# Patient Record
Sex: Female | Born: 1990 | Race: Black or African American | Hispanic: No | Marital: Single | Smoking: Former smoker
Health system: Southern US, Community
[De-identification: ages and names within clinical notes are randomized; demographics above are authoritative.]

## PROBLEM LIST (undated history)

## (undated) ENCOUNTER — Inpatient Hospital Stay (HOSPITAL_COMMUNITY): Payer: Self-pay

## (undated) DIAGNOSIS — E119 Type 2 diabetes mellitus without complications: Secondary | ICD-10-CM

## (undated) DIAGNOSIS — O24419 Gestational diabetes mellitus in pregnancy, unspecified control: Secondary | ICD-10-CM

## (undated) HISTORY — DX: Type 2 diabetes mellitus without complications: E11.9

## (undated) HISTORY — DX: Gestational diabetes mellitus in pregnancy, unspecified control: O24.419

## (undated) HISTORY — PX: NO PAST SURGERIES: SHX2092

---

## 2014-05-01 ENCOUNTER — Emergency Department (HOSPITAL_COMMUNITY)
Admission: EM | Admit: 2014-05-01 | Discharge: 2014-05-01 | Disposition: A | Discharge status: Home / Self Care | Payer: Medicaid Other | Attending: Emergency Medicine | Admitting: Emergency Medicine

## 2014-05-01 ENCOUNTER — Encounter (HOSPITAL_COMMUNITY): Payer: Self-pay | Admitting: Emergency Medicine

## 2014-05-01 DIAGNOSIS — Z3490 Encounter for supervision of normal pregnancy, unspecified, unspecified trimester: Secondary | ICD-10-CM

## 2014-05-01 DIAGNOSIS — Z3201 Encounter for pregnancy test, result positive: Secondary | ICD-10-CM | POA: Diagnosis not present

## 2014-05-01 DIAGNOSIS — Z87891 Personal history of nicotine dependence: Secondary | ICD-10-CM | POA: Insufficient documentation

## 2014-05-01 DIAGNOSIS — Z3200 Encounter for pregnancy test, result unknown: Secondary | ICD-10-CM | POA: Diagnosis present

## 2014-05-01 LAB — POC URINE PREG, ED: Preg Test, Ur: POSITIVE — AB

## 2014-05-01 MED ORDER — PRENATAL COMPLETE 14-0.4 MG PO TABS
1.0000 | ORAL_TABLET | Freq: Every day | ORAL | Status: DC
Start: 2014-05-01 — End: 2014-06-04

## 2014-05-01 NOTE — ED Notes (Signed)
Pt presents with c/o wanting to see how far along she is with her pregnancy. Pt took a pregnancy test last week and now presents to the ER to find out how far along she is.

## 2014-05-01 NOTE — ED Provider Notes (Signed)
CSN: [id number]     Arrival date & time 05/01/14  1838 History   First MD Initiated Contact with Patient 05/01/14 1844   This chart was scribed for non-physician practitioner, Wynetta EmeryNicole Pisciotta, PA, working with Lyanne CoKevin M Campos, MD by Marica OtterNusrat Rahman, ED Scribe. This patient was seen in room WTR8/WTR8 and the patient's care was started at [facility]:54 PM.  Chief Complaint  Patient presents with  . Possible Pregnancy   The history is provided by the patient. No language interpreter was used.   HPI Comments: Alicia Pollard is a 23 y.o. female who presents to the Emergency Department to determine how far along she is in her pregnancy. Patient states that in her last pregnancy she went to the emergency room and he tested [place] for dates [place]. States that she's not sure she wants to continue on with the pregnancy and wants to nature however along she is. Has no local OB/GYN. Pt reports her last menstrual period was on June 11. Pt reports she had a positive OTC pregnancy test last week. Pt denies any abdominal pain, vaginal bleeding, dysuria, abnormal vaginal bleeding syncope, palpitations, shortness of breath, nausea vomiting, change in bowel or bladder habits..   History reviewed. No pertinent past medical history. History reviewed. No pertinent past surgical history. No family history on file. History  Substance Use Topics  . Smoking status: Former Games developermoker  . Smokeless tobacco: Not on file  . Alcohol Use: No   OB History   Grav Para Term Preterm Abortions TAB SAB Ect Mult Living   1              Review of Systems  Gastrointestinal: Negative for abdominal pain.  All other systems reviewed and are negative.  Allergies  Review of patient's allergies indicates no known allergies.  Home Medications   Prior to Admission medications   Medication Sig Start Date End Date Taking? Authorizing Provider  Prenatal Vit-Fe Fumarate-FA (PRENATAL COMPLETE) 14-0.4 MG TABS Take 1 tablet by mouth  daily. 05/01/14   Nicole Pisciotta, PA-C   Triage Vitals: BP 144/81  Pulse 90  Temp(Src) 98.2 F (36.8 C) (Oral)  Resp 18  SpO2 99% Physical Exam  Nursing note and vitals reviewed. Constitutional: She is oriented to person, place, and time. She appears well-developed and well-nourished. No distress.  HENT:  Head: Normocephalic and atraumatic.  Mouth/Throat: Oropharynx is clear and moist.  Eyes: Conjunctivae and EOM are normal. Pupils are equal, round, and reactive to light.  Neck: Normal range of motion. Neck supple. No tracheal deviation present.  Cardiovascular: Normal rate, regular rhythm and intact distal pulses.   Pulmonary/Chest: Effort normal and breath sounds normal. No respiratory distress. She has no wheezes. She has no rales. She exhibits no tenderness.  Abdominal: Soft. Bowel sounds are normal. She exhibits no distension. There is no tenderness. There is no rebound and no guarding.  Musculoskeletal: Normal range of motion.  Neurological: She is alert and oriented to person, place, and time.  Skin: Skin is warm and dry.  Psychiatric: She has a normal mood and affect. Her behavior is normal.    ED Course  Procedures (including critical care time) DIAGNOSTIC STUDIES: Oxygen Saturation is 99% on RA, nl by my interpretation.    COORDINATION OF CARE: 6:57 PM-Discussed treatment plan which [facility], [facility], [facility] with pt at bedside and pt declined [facility], otherwise agreed to plan.  Labs Review Labs Reviewed  POC URINE PREG, ED -  Abnormal; Notable for the following:    Preg Test, Ur POSITIVE (*)    All other components within normal limits    Imaging Review No results found.   EKG Interpretation None      MDM   Final diagnoses:  Pregnancy    Filed Vitals:   05/01/14 1846 05/01/14 1959  BP: 144/81   Pulse: 90 85  Temp: 98.2 F (36.8 C) 97.8 F (36.6 C)  TempSrc: Oral Oral  Resp: 18 20  SpO2: 99% 100%    Alicia Pollard is a 23 y.o. female presenting with first trimester pregnancy. Last period was last month. Abdominal exam is benign. She has no emergent indications for further evaluation in the ED. I have encouraged her to followup with OB/GYN. She is not sure she wants to continue the pregnancy. I will give [facility] contact information for both [facility] and [facility] health.  Evaluation does not show pathology that would require ongoing emergent intervention or inpatient treatment. Pt is hemodynamically stable and mentating appropriately. Discussed findings and plan with patient/guardian, who agrees with care plan. All questions answered. Return precautions discussed and outpatient follow up given.   New Prescriptions   PRENATAL VIT-FE FUMARATE-FA (PRENATAL COMPLETE) 14-0.4 MG TABS    Take 1 tablet by mouth daily.   I personally performed the services described in this documentation, which was scribed in my presence. The recorded information has been reviewed and is accurate.    Wynetta Emeryicole Pisciotta, PA-C 05/01/14 2002

## 2014-05-01 NOTE — ED Notes (Signed)
Pt presents with c/o wanting to find out how far along she is in her pregnancy. Positive pregnancy test at home last week.

## 2014-05-01 NOTE — Discharge Instructions (Signed)
Please go to [facility] (Address: [address], [place], [place] [address] Phone: [phone])   Follow with OB/GYN as soon as possible.   Do NOT take any NSAIDs, such as Aspirin, Motrin, Ibuprofen, Aleve, Naproxen etc. Only take Tylenol for pain. Return to the emergency room  for any severe abdominal pain, increasing vaginal bleeding, passing out or repeated vomiting.   Obtain over-the-counter prenatal vitamins. Read the label and make sure that they have at least 400 mcg of folate acid.   Please go to the [facility] office [place] [address] to apply for coverage. Alternatively, you can could go to the DSS office in [place] to apply for emergency coverage.    Pregnancy - First Trimester During sexual intercourse, millions of sperm go into the vagina. Only 1 sperm will penetrate and fertilize the female egg while it is in the Fallopian tube. One week later, the fertilized egg implants into the wall of the uterus. An embryo begins to develop into a baby. At 6 to 8 weeks, the eyes and face are formed and the heartbeat can be seen on ultrasound. At the end of 12 weeks (first trimester), all the baby's organs are formed. Now that you are pregnant, you will want to do everything you can to have a healthy baby. Two of the most important things are to get good prenatal care and follow your caregiver's instructions. Prenatal care is all the medical care you receive before the baby's birth. It is given to prevent, find, and treat problems during the pregnancy and childbirth. PRENATAL EXAMS  During prenatal visits, your weight, blood pressure, and urine are checked. This is done to make sure you are healthy and progressing normally during the pregnancy.  A pregnant woman should gain 25 to 35 pounds during the pregnancy. However, if you are overweight or underweight, your caregiver will advise you regarding your weight.  Your caregiver will ask and answer questions for you.  Blood work, cervical  cultures, other necessary tests, and a Pap test are done during your prenatal exams. These tests are done to check on your health and the probable health of your baby. Tests are strongly recommended and done for HIV with your permission. This is the virus that causes AIDS. These tests are done because medicines can be given to help prevent your baby from being born with this infection should you have been infected without knowing it. Blood work is also used to find out your blood type, previous infections, and follow your blood levels (hemoglobin).  Low hemoglobin (anemia) is common during pregnancy. Iron and vitamins are given to help prevent this. Later in the pregnancy, blood tests for diabetes will be done along with any other tests if any problems develop.  You may need other tests to make sure you and the baby are doing well. CHANGES DURING THE FIRST TRIMESTER  Your body goes through many changes during pregnancy. They vary from person to person. Talk to your caregiver about changes you notice and are concerned about. Changes can include:  Your menstrual period stops.  The egg and sperm carry the genes that determine what you look like. Genes from you and your partner are forming a baby. The female genes determine whether the baby is a boy or a girl.  Your body increases in girth and you may feel bloated.  Feeling sick to your stomach (nauseous) and throwing up (vomiting). If the vomiting is uncontrollable, call your caregiver.  Your breasts will begin to enlarge and  become tender.  Your nipples may stick out more and become darker.  The need to urinate more. Painful urination may mean you have a bladder infection.  Tiring easily.  Loss of appetite.  Cravings for certain kinds of food.  At first, you may gain or lose a couple of pounds.  You may have changes in your emotions from day to day (excited to be pregnant or concerned something may go wrong with the pregnancy and  baby).  You may have more vivid and strange dreams. HOME CARE INSTRUCTIONS   It is very important to avoid all smoking, alcohol and non-prescribed drugs during your pregnancy. These affect the formation and growth of the baby. Avoid chemicals while pregnant to ensure the delivery of a healthy infant.  Start your prenatal visits by the 12th week of pregnancy. They are usually scheduled monthly at first, then more often in the last 2 months before delivery. Keep your caregiver's appointments. Follow your caregiver's instructions regarding medicine use, blood and lab tests, exercise, and diet.  During pregnancy, you are providing food for you and your baby. Eat regular, well-balanced meals. Choose foods such as meat, fish, milk and other low fat dairy products, vegetables, fruits, and whole-grain breads and cereals. Your caregiver will tell you of the ideal weight gain.  You can help morning sickness by keeping soda crackers at the bedside. Eat a couple before arising in the morning. You may want to use the crackers without salt on them.  Eating 4 to 5 small meals rather than 3 large meals a day also may help the nausea and vomiting.  Drinking liquids between meals instead of during meals also seems to help nausea and vomiting.  A physical sexual relationship may be continued throughout pregnancy if there are no other problems. Problems may be early (premature) leaking of amniotic fluid from the membranes, vaginal bleeding, or belly (abdominal) pain.  Exercise regularly if there are no restrictions. Check with your caregiver or physical therapist if you are unsure of the safety of some of your exercises. Greater weight gain will occur in the last 2 trimesters of pregnancy. Exercising will help:  Control your weight.  Keep you in shape.  Prepare you for labor and delivery.  Help you lose your pregnancy weight after you deliver your baby.  Wear a good support or jogging bra for breast  tenderness during pregnancy. This may help if worn during sleep too.  Ask when prenatal classes are available. Begin classes when they are offered.  Do not use hot tubs, steam rooms, or saunas.  Wear your seat belt when driving. This protects you and your baby if you are in an accident.  Avoid raw meat, uncooked cheese, cat litter boxes, and soil used by cats throughout the pregnancy. These carry germs that can cause birth defects in the baby.  The first trimester is a good time to visit your dentist for your dental health. Getting your teeth cleaned is okay. Use a softer toothbrush and brush gently during pregnancy.  Ask for help if you have financial, counseling, or nutritional needs during pregnancy. Your caregiver will be able to offer counseling for these needs as well as refer you for other special needs.  Do not take any medicines or herbs unless told by your caregiver.  Inform your caregiver if there is any mental or physical domestic violence.  Make a list of emergency phone numbers of family, friends, hospital, and police and fire departments.  Write down your  questions. Take them to your prenatal visit.  Do not douche.  Do not cross your legs.  If you have to stand for long periods of time, rotate you feet or take small steps in a circle.  You may have more vaginal secretions that may require a sanitary pad. Do not use tampons or scented sanitary pads. MEDICINES AND DRUG USE IN PREGNANCY  Take prenatal vitamins as directed. The vitamin should contain 1 milligram of folic acid. Keep all vitamins out of reach of children. Only a couple vitamins or tablets containing iron may be fatal to a baby or young child when ingested.  Avoid use of all medicines, including herbs, over-the-counter medicines, not prescribed or suggested by your caregiver. Only take over-the-counter or prescription medicines for pain, discomfort, or fever as directed by your caregiver. Do not use aspirin,  ibuprofen, or naproxen unless directed by your caregiver.  Let your caregiver also know about herbs you may be using.  Alcohol is related to a number of birth defects. This includes fetal alcohol syndrome. All alcohol, in any form, should be avoided completely. Smoking will cause low birth rate and premature babies.  Street or illegal drugs are very harmful to the baby. They are absolutely forbidden. A baby born to an addicted mother will be addicted at birth. The baby will go through the same withdrawal an adult does.  Let your caregiver know about any medicines that you have to take and for what reason you take them. SEEK MEDICAL CARE IF:  You have any concerns or worries during your pregnancy. It is better to call with your questions if you feel they cannot wait, rather than worry about them. SEEK IMMEDIATE MEDICAL CARE IF:   An unexplained oral temperature above 102 F (38.9 C) develops, or as your caregiver suggests.  You have leaking of fluid from the vagina (birth canal). If leaking membranes are suspected, take your temperature and inform your caregiver of this when you call.  There is vaginal spotting or bleeding. Notify your caregiver of the amount and how many pads are used.  You develop a bad smelling vaginal discharge with a change in the color.  You continue to feel sick to your stomach (nauseated) and have no relief from remedies suggested. You vomit blood or coffee ground-like materials.  You lose more than 2 pounds of weight in 1 week.  You gain more than 2 pounds of weight in 1 week and you notice swelling of your face, hands, feet, or legs.  You gain 5 pounds or more in 1 week (even if you do not have swelling of your hands, face, legs, or feet).  You get exposed to [place] measles and have never had them.  You are exposed to fifth disease or chickenpox.  You develop belly (abdominal) pain. Round ligament discomfort is a common non-cancerous (benign) cause of  abdominal pain in pregnancy. Your caregiver still must evaluate this.  You develop headache, fever, diarrhea, pain with urination, or shortness of breath.  You fall or are in a car accident or have any kind of trauma.  There is mental or physical violence in your home. Document Released: 10/15/2001 Document Revised: 07/15/2012 Document Reviewed: 08/31/2013 [facility] Patient Information 2015 [employer] [place]. This information is not intended to replace advice given to you by your health care provider. Make sure you discuss any questions you have with your health care provider.

## 2014-05-02 NOTE — ED Provider Notes (Signed)
Medical screening examination/treatment/procedure(s) were performed by non-physician practitioner and as supervising physician I was immediately available for consultation/collaboration.   EKG Interpretation None        Lyanne CoKevin M Campos, MD 05/02/14 [phone]

## 2014-06-04 ENCOUNTER — Encounter (HOSPITAL_COMMUNITY): Payer: Self-pay | Admitting: Emergency Medicine

## 2014-06-04 ENCOUNTER — Emergency Department (HOSPITAL_COMMUNITY)
Admission: EM | Admit: 2014-06-04 | Discharge: 2014-06-04 | Disposition: A | Discharge status: Home / Self Care | Payer: Medicaid Other | Attending: Emergency Medicine | Admitting: Emergency Medicine

## 2014-06-04 DIAGNOSIS — O26899 Other specified pregnancy related conditions, unspecified trimester: Secondary | ICD-10-CM

## 2014-06-04 DIAGNOSIS — Z87891 Personal history of nicotine dependence: Secondary | ICD-10-CM | POA: Diagnosis not present

## 2014-06-04 DIAGNOSIS — O9989 Other specified diseases and conditions complicating pregnancy, childbirth and the puerperium: Secondary | ICD-10-CM | POA: Diagnosis not present

## 2014-06-04 DIAGNOSIS — R110 Nausea: Secondary | ICD-10-CM | POA: Insufficient documentation

## 2014-06-04 DIAGNOSIS — R52 Pain, unspecified: Secondary | ICD-10-CM

## 2014-06-04 DIAGNOSIS — M549 Dorsalgia, unspecified: Secondary | ICD-10-CM | POA: Insufficient documentation

## 2014-06-04 DIAGNOSIS — J069 Acute upper respiratory infection, unspecified: Secondary | ICD-10-CM | POA: Diagnosis not present

## 2014-06-04 DIAGNOSIS — J029 Acute pharyngitis, unspecified: Secondary | ICD-10-CM

## 2014-06-04 DIAGNOSIS — R0981 Nasal congestion: Secondary | ICD-10-CM

## 2014-06-04 MED ORDER — PREDNISONE 20 MG PO TABS: ORAL_TABLET | ORAL | Status: DC

## 2014-06-04 MED ORDER — ONDANSETRON 4 MG PO TBDP: 4.0000 mg | ORAL_TABLET | Freq: Three times a day (TID) | ORAL | Status: DC | PRN

## 2014-06-04 MED ORDER — FLUTICASONE PROPIONATE 50 MCG/ACT NA SUSP: 2.0000 | Freq: Every day | NASAL | Status: DC

## 2014-06-04 NOTE — ED Provider Notes (Signed)
Medical screening examination/treatment/procedure(s) were performed by non-physician practitioner and as supervising physician I was immediately available for consultation/collaboration.   EKG Interpretation None       Cannen Dupras K Linker, MD 06/04/14 2042 

## 2014-06-04 NOTE — Discharge Instructions (Signed)
Continue to stay well-hydrated. Gargle warm salt water and spit it out. Continue to alternate between Tylenol for pain or fever. May consider over-the-counter Benadryl for additional relief. Followup with your primary care doctor in 5-7 days for recheck of ongoing symptoms. Return to emergency department for emergent changing or worsening of symptoms. Use flonase to help with your nasal congestion. Use prednisone as directed to help with your neck pain related to the lymph nodes from your upper respiratory infection. Use zofran as needed for nausea, but see your OBGYN for the remainder of your prenatal care.     Upper Respiratory Infection, Adult An upper respiratory infection (URI) is also sometimes known as the common cold. The upper respiratory tract includes the nose, sinuses, throat, trachea, and bronchi. Bronchi are the airways leading to the lungs. Most people improve within 1 week, but symptoms can last up to 2 weeks. A residual cough may last even longer.  CAUSES Many different viruses can infect the tissues lining the upper respiratory tract. The tissues become irritated and inflamed and often become very moist. Mucus production is also common. A cold is contagious. You can easily spread the virus to others by oral contact. This includes kissing, sharing a glass, coughing, or sneezing. Touching your mouth or nose and then touching a surface, which is then touched by another person, can also spread the virus. SYMPTOMS  Symptoms typically develop 1 to 3 days after you come in contact with a cold virus. Symptoms vary from person to person. They may include:  Runny nose.  Sneezing.  Nasal congestion.  Sinus irritation.  Sore throat.  Loss of voice (laryngitis).  Cough.  Fatigue.  Muscle aches.  Loss of appetite.  Headache.  Low-grade fever. DIAGNOSIS  You might diagnose your own cold based on familiar symptoms, since most people get a cold 2 to 3 times a year. Your caregiver  can confirm this based on your exam. Most importantly, your caregiver can check that your symptoms are not due to another disease such as strep throat, sinusitis, pneumonia, asthma, or epiglottitis. Blood tests, throat tests, and X-rays are not necessary to diagnose a common cold, but they may sometimes be helpful in excluding other more serious diseases. Your caregiver will decide if any further tests are required. RISKS AND COMPLICATIONS  You may be at risk for a more severe case of the common cold if you smoke cigarettes, have chronic heart disease (such as heart failure) or lung disease (such as asthma), or if you have a weakened immune system. The very young and very old are also at risk for more serious infections. Bacterial sinusitis, middle ear infections, and bacterial pneumonia can complicate the common cold. The common cold can worsen asthma and chronic obstructive pulmonary disease (COPD). Sometimes, these complications can require emergency medical care and may be life-threatening. PREVENTION  The best way to protect against getting a cold is to practice good hygiene. Avoid oral or hand contact with people with cold symptoms. Wash your hands often if contact occurs. There is no clear evidence that vitamin C, vitamin E, echinacea, or exercise reduces the chance of developing a cold. However, it is always recommended to get plenty of rest and practice good nutrition. TREATMENT  Treatment is directed at relieving symptoms. There is no cure. Antibiotics are not effective, because the infection is caused by a virus, not by bacteria. Treatment may include:  Increased fluid intake. Sports drinks offer valuable electrolytes, sugars, and fluids.  Breathing heated mist  or steam (vaporizer or shower).  Eating chicken soup or other clear broths, and maintaining good nutrition.  Getting plenty of rest.  Using gargles or lozenges for comfort.  Controlling fevers with ibuprofen or acetaminophen as  directed by your caregiver.  Increasing usage of your inhaler if you have asthma. Zinc gel and zinc lozenges, taken in the first 24 hours of the common cold, can shorten the duration and lessen the severity of symptoms. Pain medicines may help with fever, muscle aches, and throat pain. A variety of non-prescription medicines are available to treat congestion and runny nose. Your caregiver can make recommendations and may suggest nasal or lung inhalers for other symptoms.  HOME CARE INSTRUCTIONS   Only take over-the-counter or prescription medicines for pain, discomfort, or fever as directed by your caregiver.  Use a warm mist humidifier or inhale steam from a shower to increase air moisture. This may keep secretions moist and make it easier to breathe.  Drink enough water and fluids to keep your urine clear or pale yellow.  Rest as needed.  Return to work when your temperature has returned to normal or as your caregiver advises. You may need to stay home longer to avoid infecting others. You can also use a face mask and careful hand washing to prevent spread of the virus. SEEK MEDICAL CARE IF:   After the first few days, you feel you are getting worse rather than better.  You need your caregiver's advice about medicines to control symptoms.  You develop chills, worsening shortness of breath, or brown or red sputum. These may be signs of pneumonia.  You develop yellow or brown nasal discharge or pain in the face, especially when you bend forward. These may be signs of sinusitis.  You develop a fever, swollen neck glands, pain with swallowing, or white areas in the back of your throat. These may be signs of strep throat. SEEK IMMEDIATE MEDICAL CARE IF:   You have a fever.  You develop severe or persistent headache, ear pain, sinus pain, or chest pain.  You develop wheezing, a prolonged cough, cough up blood, or have a change in your usual mucus (if you have chronic lung disease).  You  develop sore muscles or a stiff neck. Document Released: 04/16/2001 Document Revised: 01/13/2012 Document Reviewed: 01/26/2014 [facility] Patient Information 2015 [place], [place]. This information is not intended to replace advice given to you by your health care provider. Make sure you discuss any questions you have with your health care provider.  Salt Water Gargle This solution will help make your mouth and throat feel better. HOME CARE INSTRUCTIONS   Mix 1 teaspoon of salt in 8 ounces of warm water.  Gargle with this solution as much or often as you need or as directed. Swish and gargle gently if you have any sores or wounds in your mouth.  Do not swallow this mixture. Document Released: 07/25/2004 Document Revised: 01/13/2012 Document Reviewed: 12/16/2008 [facility] Patient Information 2015 [place], [place]. This information is not intended to replace advice given to you by your health care provider. Make sure you discuss any questions you have with your health care provider.  Morning Sickness Morning sickness is when you feel sick to your stomach (nauseous) during pregnancy. This nauseous feeling may or may not come with vomiting. It often occurs in the morning but can be a problem any time of day. Morning sickness is most common during the first trimester, but it may continue throughout pregnancy. While morning sickness  is unpleasant, it is usually harmless unless you develop severe and continual vomiting (hyperemesis gravidarum). This condition requires more intense treatment.  CAUSES  The cause of morning sickness is not completely known but seems to be related to normal hormonal changes that occur in pregnancy. RISK FACTORS You are at greater risk if you:  Experienced nausea or vomiting before your pregnancy.  Had morning sickness during a previous pregnancy.  Are pregnant with more than one baby, such as twins. TREATMENT  Do not use any medicines (prescription, over-the-counter, or  herbal) for morning sickness without first talking to your health care provider. Your health care provider may prescribe or recommend:  Vitamin B6 supplements.  Anti-nausea medicines.  The herbal medicine ginger. HOME CARE INSTRUCTIONS   Only take over-the-counter or prescription medicines as directed by your health care provider.  Taking multivitamins before getting pregnant can prevent or decrease the severity of morning sickness in most women.  Eat a piece of dry toast or unsalted crackers before getting out of bed in the morning.  Eat five or six small meals a day.  Eat dry and bland foods (rice, baked potato). Foods high in carbohydrates are often helpful.  Do not drink liquids with your meals. Drink liquids between meals.  Avoid greasy, fatty, and spicy foods.  Get someone to cook for you if the smell of any food causes nausea and vomiting.  If you feel nauseous after taking prenatal vitamins, take the vitamins at night or with a snack.  Snack on protein foods (nuts, yogurt, cheese) between meals if you are hungry.  Eat unsweetened gelatins for desserts.  Wearing an acupressure wristband (worn for sea sickness) may be helpful.  Acupuncture may be helpful.  Do not smoke.  Get a humidifier to keep the air in your house free of odors.  Get plenty of fresh air. SEEK MEDICAL CARE IF:   Your home remedies are not working, and you need medicine.  You feel dizzy or lightheaded.  You are losing weight. SEEK IMMEDIATE MEDICAL CARE IF:   You have persistent and uncontrolled nausea and vomiting.  You pass out (faint). MAKE SURE YOU:  Understand these instructions.  Will watch your condition.  Will get help right away if you are not doing well or get worse. Document Released: 12/12/2006 Document Revised: 10/26/2013 Document Reviewed: 04/07/2013 [facility] Patient Information 2015 [place], [place]. This information is not intended to replace advice given to you by  your health care provider. Make sure you discuss any questions you have with your health care provider.

## 2014-06-04 NOTE — ED Provider Notes (Signed)
CSN: [id number]     Arrival date & time 06/04/14  1630 History  This chart was scribed for non-physician practitioner working with No att. providers found by Elveria Risingimelie Horne, ED Scribe. This patient was seen in room WTR7/WTR7 and the patient's care was started at 6:53 PM.   Chief Complaint  Patient presents with  . Neck Pain  . Back Pain     Patient is a 23 y.o. female presenting with neck pain and back pain. The history is provided by the patient. No language interpreter was used.  Neck Pain Pain location:  R side Quality:  Cramping, aching and stiffness Stiffness is present:  All day Radiates to: back and generalized body. Pain severity:  Severe Pain is:  Same all the time Onset quality:  Gradual Duration:  1 day Timing:  Constant Progression:  Unchanged Chronicity:  New Context: not fall, not lifting a heavy object, not MVA and not recent injury   Relieved by:  Heat Worsened by:  Coughing Ineffective treatments:  None tried Associated symptoms: no chest pain, no fever, no headaches, no numbness, no paresis, no photophobia, no tingling, no visual change and no weakness   Risk factors: no hx of head and neck radiation   Back Pain Associated symptoms: no abdominal pain, no chest pain, no dysuria, no fever, no headaches, no numbness, no pelvic pain, no tingling and no weakness    HPI Comments: Garry HeaterYasmine Masi is a 23 y.o. female who is [redacted]wks pregnant, who presents to the Emergency Department complaining of constant right sided neck pain with radiation to her lower back and "all over", ongoing for one day. Patient attributes pain to having slept wrong or having a cold. She currently rates her pain at 10/10 in severity. Patient works overnight at [employer]. Before her shift she was experiencing generalized myalgia.  Patient reports exacerbation with coughing and movement of her neck. Patient denies treatment, but reports mild alleviation of neck pain while taking a warm shower. Patient denies  causative trauma or recent injury. Patient also reports cold symptoms including sore throat, rhinorrhea, congestion, and productive cough with mucus onset yesterday. Patient denies history of seasonal allergies. Patient is currently pregnant.   History reviewed. No pertinent past medical history. History reviewed. No pertinent past surgical history. No family history on file. History  Substance Use Topics  . Smoking status: Former Games developermoker  . Smokeless tobacco: Not on file  . Alcohol Use: No   OB History   Grav Para Term Preterm Abortions TAB SAB Ect Mult Living   1              Review of Systems  Constitutional: Positive for appetite change (decreased recently). Negative for fever and chills.  HENT: Positive for congestion, rhinorrhea, sinus pressure and sore throat. Negative for drooling, ear discharge, ear pain, hearing loss, postnasal drip, tinnitus, trouble swallowing and voice change.   Eyes: Negative for photophobia, discharge, redness and visual disturbance.  Respiratory: Positive for cough. Negative for chest tightness, shortness of breath and wheezing.   Cardiovascular: Negative for chest pain.  Gastrointestinal: Positive for nausea (related to her pregnancy). Negative for vomiting, abdominal pain and diarrhea.  Genitourinary: Negative for dysuria, hematuria, vaginal bleeding, vaginal discharge, vaginal pain and pelvic pain.  Musculoskeletal: Positive for myalgias and neck pain. Negative for arthralgias, back pain, gait problem, joint swelling and neck stiffness.  Skin: Negative for rash.  Neurological: Negative for dizziness, tingling, weakness, light-headedness, numbness and headaches.  Hematological: Positive for adenopathy.  10 Systems reviewed and are negative for acute change except as noted in the HPI.     Allergies  Review of patient's allergies indicates no known allergies.  Home Medications   Prior to Admission medications   Medication Sig Start Date End Date  Taking? Authorizing Provider  fluticasone (FLONASE) 50 MCG/ACT nasal spray Place 2 sprays into both nostrils daily. 06/04/14   Natanel Snavely Strupp Camprubi-Soms, PA-C  ondansetron (ZOFRAN ODT) 4 MG disintegrating tablet Take 1 tablet (4 mg total) by mouth every 8 (eight) hours as needed for nausea or vomiting. 06/04/14   Keontae Levingston Strupp Camprubi-Soms, PA-C  predniSONE (DELTASONE) 20 MG tablet 2 tabs po daily x 3 days 06/04/14   Donnita Falls Camprubi-Soms, PA-C   Triage Vitals: BP 125/75  Pulse 88  Temp(Src) 98.5 F (36.9 C) (Oral)  Resp 17  SpO2 98% Physical Exam  Nursing note and vitals reviewed. Constitutional: She is oriented to person, place, and time. Vital signs are normal. She appears well-developed and well-nourished.  Non-toxic appearance. No distress.  VSS, afebrile and nontoxic  HENT:  Head: Normocephalic and atraumatic.  Right Ear: Hearing and external ear normal.  Left Ear: Hearing, tympanic membrane, external ear and ear canal normal.  Nose: Mucosal edema and rhinorrhea present. Right sinus exhibits no maxillary sinus tenderness and no frontal sinus tenderness. Left sinus exhibits no maxillary sinus tenderness and no frontal sinus tenderness.  Mouth/Throat: Uvula is midline and mucous membranes are normal. No trismus in the jaw. No uvula swelling. Posterior oropharyngeal erythema present. No oropharyngeal exudate, posterior oropharyngeal edema or tonsillar abscesses.  [place]/AT. R ear occluded with cerumen, but no pain with pulling pinna. L TM noninjected and canal clear. B/L nasal turbinates edematous and erythematous with rhinorrhea. No sinus TTP. Post oropharynx mildly injected with no exudates, pt s/p tonsillectomy therefore no tonsillar tissue swelling. No uvular swelling. MMM. Uvula midline, no trismus  Eyes: Conjunctivae, EOM and lids are normal. Pupils are equal, round, and reactive to light.  Neck: Normal range of motion. Neck supple. Muscular tenderness present. No spinous process  tenderness present. No rigidity. Normal range of motion present.    FROM intact with no spinous process TTP. Mild trapezius TTP of R side, with no spasm. Cervical LAD diffusely throughout, and tender on R side.  Cardiovascular: Normal rate, regular rhythm, normal heart sounds and intact distal pulses.  Exam reveals no gallop and no friction rub.   No murmur heard. Pulmonary/Chest: Effort normal and breath sounds normal. No respiratory distress. She has no decreased breath sounds. She has no wheezes. She has no rhonchi. She has no rales.  CTAB in all lung fields, no w/r/r  Abdominal: Soft. Normal appearance and bowel sounds are normal. She exhibits no distension. There is no tenderness. There is no rigidity, no rebound and no guarding.  Soft, NT, ND, obese and limited secondary to body habitus. No r/g/r  Musculoskeletal: Normal range of motion.  FROM in all major joints including b/l shoulders and cervical spine as noted above. Trapezius on R side mildly TTP with cervical LAD. Sensation grossly intact in all extremities, strength 5/5 in all extremities.  Lymphadenopathy:    She has cervical adenopathy.       Right cervical: Superficial cervical and deep cervical adenopathy present.       Left cervical: Superficial cervical and deep cervical adenopathy present.  Diffuse cervical LAD bilaterally, TTP.  Neurological: She is alert and oriented to person, place, and time. She has normal strength. No sensory deficit.  Strength 5/5 in all extremities, sensation grossly intact in all extremities.  Skin: Skin is warm, dry and intact. No rash noted.  Psychiatric: She has a normal mood and affect. Her behavior is normal.    ED Course  Procedures (including critical care time) COORDINATION OF CARE: 7:07 PM- Discussed treatment plan with patient at bedside and patient agreed to plan. Patient informed of plans for symptomatic treatment. Patient advised to rest and stay hydrated with water or Gatorade;  instructed to stay away from soda or drinks that dehydrate. Will prescribe steroid spray for nasal congestion and Prednisone for her neck pain and stiffness.  Zofran given for nausea in pregnancy but discussed need to f/up with OBGYN.  Labs Review Labs Reviewed - No data to display  Imaging Review No results found.   EKG Interpretation None      MDM   Final diagnoses:  Pharyngitis  Body aches  Pregnancy related nausea, antepartum  Nasal sinus congestion  Upper respiratory infection    Afebrile nontoxic pregnant patient here for generalized body aches and neck pain related to cervical lymphadenopathy. Appears to have viral URI, doubt strep infection at this time given that the patient has had a tonsillectomy, has no exudates, has a cough, is afebrile, and is not in the proper age range for Strep. Discussed symptomatic care of viral URI, with hydration and rest. Discussed use of Flonase for her nasal congestion, and prednisone to help with cervical lymphadenopathy. Discussed use of Zofran for nausea in pregnancy, but that she needs to followup with her OB/GYN for prenatal care. She is [redacted] weeks pregnant at this time. No abdominal complaints at this time, do not need to perform pelvic or obtain any other labs at this time. Clear lung exam therefore no imaging at this time, especially since pt is pregnant. I explained the diagnosis and have given explicit precautions to return to the ER including for any other new or worsening symptoms. The patient understands and accepts the medical plan as it's been dictated and I have answered their questions. Discharge instructions concerning home care and prescriptions have been given. The patient is STABLE and is discharged to home in good condition.   I personally performed the services described in this documentation, which was scribed in my presence. The recorded information has been reviewed and is accurate.  BP 123/79  Pulse 82  Temp(Src) 98.5 F  (36.9 C) (Oral)  Resp 17  SpO2 100%  Meds ordered this encounter  Medications  . predniSONE (DELTASONE) 20 MG tablet    Sig: 2 tabs po daily x 3 days    Dispense:  6 tablet    Refill:  0    Order Specific Question:  Supervising Provider    Answer:  Eber Hong D [[id number]]  . fluticasone (FLONASE) 50 MCG/ACT nasal spray    Sig: Place 2 sprays into both nostrils daily.    Dispense:  16 g    Refill:  0    Order Specific Question:  Supervising Provider    Answer:  Eber Hong D [[id number]]  . ondansetron (ZOFRAN ODT) 4 MG disintegrating tablet    Sig: Take 1 tablet (4 mg total) by mouth every 8 (eight) hours as needed for nausea or vomiting.    Dispense:  15 tablet    Refill:  0    Order Specific Question:  Supervising Provider    Answer:  Vida Roller [address] Camprubi-Soms, PA-C 06/04/14 2038

## 2014-06-04 NOTE — ED Notes (Signed)
Patient is alert and oriented x3.  She was given DC instructions and follow up visit instructions.  Patient gave verbal understanding. She was DC ambulatory under her own power to home.  V/S stable.  He was not showing any signs of distress on DC 

## 2014-06-04 NOTE — ED Notes (Signed)
Pt here with c/o of neck pain radiating down into back. Pain 10/10. States that's she think she may have slept wrong.

## 2014-07-20 ENCOUNTER — Emergency Department (HOSPITAL_COMMUNITY)
Admission: EM | Admit: 2014-07-20 | Discharge: 2014-07-20 | Disposition: A | Discharge status: Home / Self Care | Payer: Medicaid Other | Attending: Emergency Medicine | Admitting: Emergency Medicine

## 2014-07-20 ENCOUNTER — Emergency Department (HOSPITAL_COMMUNITY): Payer: Medicaid Other

## 2014-07-20 DIAGNOSIS — R8271 Bacteriuria: Secondary | ICD-10-CM

## 2014-07-20 DIAGNOSIS — O26859 Spotting complicating pregnancy, unspecified trimester: Secondary | ICD-10-CM | POA: Diagnosis not present

## 2014-07-20 DIAGNOSIS — N939 Abnormal uterine and vaginal bleeding, unspecified: Secondary | ICD-10-CM

## 2014-07-20 DIAGNOSIS — O209 Hemorrhage in early pregnancy, unspecified: Secondary | ICD-10-CM | POA: Diagnosis present

## 2014-07-20 DIAGNOSIS — O2340 Unspecified infection of urinary tract in pregnancy, unspecified trimester: Secondary | ICD-10-CM

## 2014-07-20 DIAGNOSIS — D72829 Elevated white blood cell count, unspecified: Secondary | ICD-10-CM | POA: Insufficient documentation

## 2014-07-20 DIAGNOSIS — Z87891 Personal history of nicotine dependence: Secondary | ICD-10-CM | POA: Insufficient documentation

## 2014-07-20 DIAGNOSIS — N390 Urinary tract infection, site not specified: Secondary | ICD-10-CM

## 2014-07-20 DIAGNOSIS — O99891 Other specified diseases and conditions complicating pregnancy: Secondary | ICD-10-CM | POA: Diagnosis not present

## 2014-07-20 DIAGNOSIS — O9989 Other specified diseases and conditions complicating pregnancy, childbirth and the puerperium: Secondary | ICD-10-CM | POA: Diagnosis not present

## 2014-07-20 LAB — COMPREHENSIVE METABOLIC PANEL
ALK PHOS: 66 U/L (ref 39–117)
ALT: 17 U/L (ref 0–35)
ANION GAP: 11 (ref 5–15)
AST: 17 U/L (ref 0–37)
Albumin: 3 g/dL — ABNORMAL LOW (ref 3.5–5.2)
BUN: 7 mg/dL (ref 6–23)
CO2: 21 mEq/L (ref 19–32)
CREATININE: 0.61 mg/dL (ref 0.50–1.10)
Calcium: 9 mg/dL (ref 8.4–10.5)
Chloride: 104 mEq/L (ref 96–112)
GFR calc non Af Amer: >90 mL/min (ref >90)
GLUCOSE: 97 mg/dL (ref 70–99)
POTASSIUM: 3.6 meq/L — AB (ref 3.7–5.3)
Sodium: 136 mEq/L — ABNORMAL LOW (ref 137–147)
TOTAL PROTEIN: 6.8 g/dL (ref 6.0–8.3)
Total Bilirubin: 0.4 mg/dL (ref 0.3–1.2)

## 2014-07-20 LAB — URINALYSIS, ROUTINE W REFLEX MICROSCOPIC
Bilirubin Urine: NEGATIVE
GLUCOSE, UA: NEGATIVE mg/dL
Hgb urine dipstick: NEGATIVE
Ketones, ur: NEGATIVE mg/dL
Nitrite: NEGATIVE
Protein, ur: NEGATIVE mg/dL
Specific Gravity, Urine: 1.031 — ABNORMAL HIGH (ref 1.005–1.030)
Urobilinogen, UA: 1 mg/dL (ref 0.0–1.0)
pH: 6 (ref 5.0–8.0)

## 2014-07-20 LAB — CBC WITH DIFFERENTIAL/PLATELET
Basophils Absolute: 0 10*3/uL (ref 0.0–0.1)
Basophils Relative: 0 % (ref 0–1)
Eosinophils Absolute: 0.2 10*3/uL (ref 0.0–0.7)
Eosinophils Relative: 2 % (ref 0–5)
HEMATOCRIT: 33.2 % — AB (ref 36.0–46.0)
Hemoglobin: 10.8 g/dL — ABNORMAL LOW (ref 12.0–15.0)
LYMPHS ABS: 2.6 10*3/uL (ref 0.7–4.0)
Lymphocytes Relative: 21 % (ref 12–46)
MCH: 26.4 pg (ref 26.0–34.0)
MCHC: 32.5 g/dL (ref 30.0–36.0)
MCV: 81.2 fL (ref 78.0–100.0)
MONO ABS: 0.8 10*3/uL (ref 0.1–1.0)
MONOS PCT: 6 % (ref 3–12)
Neutro Abs: 8.7 10*3/uL — ABNORMAL HIGH (ref 1.7–7.7)
Neutrophils Relative %: 71 % (ref 43–77)
Platelets: 373 10*3/uL (ref 150–400)
RBC: 4.09 MIL/uL (ref 3.87–5.11)
RDW: 14.2 % (ref 11.5–15.5)
WBC: 12.3 10*3/uL — ABNORMAL HIGH (ref 4.0–10.5)

## 2014-07-20 LAB — URINE MICROSCOPIC-ADD ON

## 2014-07-20 LAB — HCG, QUANTITATIVE, PREGNANCY: hCG, Beta Chain, Quant, S: 11690 m[IU]/mL — ABNORMAL HIGH (ref <5)

## 2014-07-20 LAB — ABO/RH: ABO/RH(D): B POS

## 2014-07-20 LAB — WET PREP, GENITAL
Clue Cells Wet Prep HPF POC: NONE SEEN
Trich, Wet Prep: NONE SEEN
Yeast Wet Prep HPF POC: NONE SEEN

## 2014-07-20 MED ORDER — CEPHALEXIN 500 MG PO CAPS
500.0000 mg | ORAL_CAPSULE | Freq: Once | ORAL | Status: AC
  Administered 2014-07-20: 500 mg via ORAL
  Filled 2014-07-20: qty 1

## 2014-07-20 MED ORDER — CEPHALEXIN 500 MG PO CAPS: 500.0000 mg | ORAL_CAPSULE | Freq: Two times a day (BID) | ORAL | Status: DC

## 2014-07-20 NOTE — ED Provider Notes (Signed)
CSN: [id number]     Arrival date & time 07/20/14  0127 History   First MD Initiated Contact with Patient 07/20/14 0431     Chief Complaint  Patient presents with  . Vaginal Bleeding     (Consider location/radiation/quality/duration/timing/severity/associated sxs/prior Treatment) HPI Comments: Patient is a 23 y/o [id number] female who states she is [redacted] weeks pregnant who presents to the ED for vaginal spotting. Patient endorses a small amount of bright red blood from her vaginal canal. Symptoms began yesterday evening and resolved spontaneously prior to arrival. Patient states that symptoms were associated with suprapubic abdominal cramping. Cramping is nonradiating and has also resolved, per patient. No medications taken prior to arrival. Patient denies any symptoms at present. She denies fever, chest pain, shortness of breath, nausea, vomiting, diarrhea, melena or hematochezia, dysuria or hematuria, and syncope.  Patient is a 23 y.o. female presenting with vaginal bleeding. The history is provided by the patient. No language interpreter was used.  Vaginal Bleeding Associated symptoms: abdominal pain (cramping)   Associated symptoms: no dysuria, no fever and no vaginal discharge     No past medical history on file. No past surgical history on file. No family history on file. History  Substance Use Topics  . Smoking status: Former Games developer  . Smokeless tobacco: Not on file  . Alcohol Use: No   OB History   Grav Para Term Preterm Abortions TAB SAB Ect Mult Living   1               Review of Systems  Constitutional: Negative for fever.  Respiratory: Negative for shortness of breath.   Cardiovascular: Negative for chest pain.  Gastrointestinal: Positive for abdominal pain (cramping).  Genitourinary: Positive for vaginal bleeding. Negative for dysuria, hematuria and vaginal discharge.  All other systems reviewed and are negative.   Allergies  Review of patient's allergies indicates no  known allergies.  Home Medications   Prior to Admission medications   Not on File   BP 132/54  Pulse 78  Temp(Src) 98.4 F (36.9 C) (Oral)  Resp 18  SpO2 100%  Physical Exam  Nursing note and vitals reviewed. Constitutional: She is oriented to person, place, and time. She appears well-developed and well-nourished. No distress.  Nontoxic/nonseptic appearing  HENT:  Head: Normocephalic and atraumatic.  Eyes: Conjunctivae and EOM are normal. No scleral icterus.  Neck: Normal range of motion.  Pulmonary/Chest: Effort normal. No respiratory distress. She has no wheezes.  Chest expansion symmetric  Abdominal: Soft. There is no tenderness. There is no rebound.  Soft obese abdomen with no TTP. No peritoneal signs or masses.  Genitourinary: Vagina normal. There is no rash, tenderness, lesion or injury on the right labia. There is no rash, tenderness, lesion or injury on the left labia. Uterus is not tender. Cervix exhibits no motion tenderness and no friability. Right adnexum displays no mass, no tenderness and no fullness. Left adnexum displays no mass, no tenderness and no fullness.  Musculoskeletal: Normal range of motion.  Neurological: She is alert and oriented to person, place, and time. She exhibits normal muscle tone. Coordination normal.  GCS 15. Patient moves extremities without ataxia.  Skin: Skin is warm and dry. No rash noted. She is not diaphoretic. No erythema. No pallor.  Psychiatric: She has a normal mood and affect. Her behavior is normal.    ED Course  Procedures (including critical care time) Labs Review Labs Reviewed  URINALYSIS, ROUTINE W REFLEX MICROSCOPIC - Abnormal; Notable for the following:  APPearance TURBID (*)    Specific Gravity, Urine 1.031 (*)    Leukocytes, UA LARGE (*)    All other components within normal limits  HCG, QUANTITATIVE, PREGNANCY - Abnormal; Notable for the following:    hCG, Beta Chain, Quant, S 11690 (*)    All other components  within normal limits  CBC WITH DIFFERENTIAL - Abnormal; Notable for the following:    WBC 12.3 (*)    Hemoglobin 10.8 (*)    HCT 33.2 (*)    Neutro Abs 8.7 (*)    All other components within normal limits  COMPREHENSIVE METABOLIC PANEL - Abnormal; Notable for the following:    Sodium 136 (*)    Potassium 3.6 (*)    Albumin 3.0 (*)    All other components within normal limits  URINE MICROSCOPIC-ADD ON - Abnormal; Notable for the following:    Squamous Epithelial / LPF MANY (*)    Bacteria, UA MANY (*)    All other components within normal limits  WET PREP, GENITAL  GC/CHLAMYDIA PROBE AMP  ABO/RH    Imaging Review No results found.   EKG Interpretation None      MDM   Final diagnoses:  Vaginal spotting  Asymptomatic bacteriuria during pregnancy, unspecified trimester    23 year old female presents to the emergency department for further evaluation of vaginal spotting and lower abdominal/suprapubic cramping. Patient states that she is currently [redacted] weeks pregnant. Patient denies any symptoms at present. Her physical exam is benign. Labs with mild leukocytosis. H/H stable. UA today does suggest asymptomatic bacteriuria; will plan to tx with Keflex.   Pelvic ultrasound ordered for further evaluation of symptoms. Imaging pending. Wet prep pending. Patient signed out to Junius Finner, PA-C who will follow up on imaging and disposition appropriately.    Filed Vitals:   07/20/14 0144 07/20/14 0447  BP: 123/70 132/54  Pulse: 85 78  Temp: 98.4 F (36.9 C)   TempSrc: Oral   Resp: 16 18  SpO2: 100% 100%       Antony Madura, PA-C 07/20/14 0745

## 2014-07-20 NOTE — ED Provider Notes (Signed)
7:46 AM Pt signed out at shift change by Antony Madura, PA-C.  Pt is a 22yo female, reports being [redacted] weeks pregnant, presenting to ED with c/o vaginal spotting and lower abdominal/suprapubic cramping.  Pt denies any symptoms in ED.  PE-benign.  Labs significant with mild leukocytosis, H/H stable.  UA suggestive of asymptomatic bacteriuria, will tx with keflex.   Wet prep: significant for moderate WBC, otherwise, unremarkable. No yeast, trich, or clue cells. GC/Chlamydia probe: pending.  Pelvic U/S: normal limited OB U/S. Normal amniotic fluid, placenta, and fetal heart rate.   Pt may be discharged home with keflex. Advised to f/u as previously scheduled with her OB/GYN next week. Return precautions provided. Pt verbalized understanding and agreement with tx plan.   Junius Finner, PA-C 07/20/14 [id number]

## 2014-07-20 NOTE — ED Notes (Signed)
Pt states that she spotted earlier in her pregnancy and it stopped but resumed tonight. States that it was only specks of blood on the toilet paper. Some cramping has resided. Alert and oriented. NAD at this time.

## 2014-07-21 LAB — GC/CHLAMYDIA PROBE AMP
CT Probe RNA: NEGATIVE
GC Probe RNA: NEGATIVE

## 2014-07-21 NOTE — ED Provider Notes (Signed)
Medical screening examination/treatment/procedure(s) were performed by non-physician practitioner and as supervising physician I was immediately available for consultation/collaboration.   EKG Interpretation None        Jory Tanguma H Deliana Avalos, MD 07/21/14 2123 

## 2014-07-21 NOTE — ED Provider Notes (Signed)
Medical screening examination/treatment/procedure(s) were performed by non-physician practitioner and as supervising physician I was immediately available for consultation/collaboration.   EKG Interpretation None        David H Yao, MD 07/21/14 2123 

## 2014-09-06 ENCOUNTER — Encounter (HOSPITAL_COMMUNITY): Payer: Self-pay | Admitting: Emergency Medicine

## 2016-09-03 ENCOUNTER — Emergency Department (HOSPITAL_COMMUNITY)
Admission: EM | Admit: 2016-09-03 | Discharge: 2016-09-03 | Disposition: A | Discharge status: Home / Self Care | Payer: Medicaid Other | Attending: Emergency Medicine | Admitting: Emergency Medicine

## 2016-09-03 ENCOUNTER — Encounter (HOSPITAL_COMMUNITY): Payer: Self-pay

## 2016-09-03 DIAGNOSIS — Z3A01 Less than 8 weeks gestation of pregnancy: Secondary | ICD-10-CM | POA: Insufficient documentation

## 2016-09-03 DIAGNOSIS — Z87891 Personal history of nicotine dependence: Secondary | ICD-10-CM | POA: Insufficient documentation

## 2016-09-03 DIAGNOSIS — O219 Vomiting of pregnancy, unspecified: Secondary | ICD-10-CM | POA: Insufficient documentation

## 2016-09-03 DIAGNOSIS — Z3490 Encounter for supervision of normal pregnancy, unspecified, unspecified trimester: Secondary | ICD-10-CM

## 2016-09-03 DIAGNOSIS — F419 Anxiety disorder, unspecified: Secondary | ICD-10-CM | POA: Insufficient documentation

## 2016-09-03 DIAGNOSIS — O99341 Other mental disorders complicating pregnancy, first trimester: Secondary | ICD-10-CM | POA: Insufficient documentation

## 2016-09-03 LAB — COMPREHENSIVE METABOLIC PANEL
ALT: 14 U/L (ref 14–54)
AST: 15 U/L (ref 15–41)
Albumin: 3.5 g/dL (ref 3.5–5.0)
Alkaline Phosphatase: 50 U/L (ref 38–126)
Anion gap: 5 (ref 5–15)
BUN: 7 mg/dL (ref 6–20)
CHLORIDE: 106 mmol/L (ref 101–111)
CO2: 25 mmol/L (ref 22–32)
Calcium: 8.2 mg/dL — ABNORMAL LOW (ref 8.9–10.3)
Creatinine, Ser: 0.73 mg/dL (ref 0.44–1.00)
GFR calc Af Amer: >60 mL/min (ref >60)
Glucose, Bld: 91 mg/dL (ref 65–99)
POTASSIUM: 3.5 mmol/L (ref 3.5–5.1)
SODIUM: 136 mmol/L (ref 135–145)
Total Bilirubin: 0.5 mg/dL (ref 0.3–1.2)
Total Protein: 6.4 g/dL — ABNORMAL LOW (ref 6.5–8.1)

## 2016-09-03 LAB — URINALYSIS, ROUTINE W REFLEX MICROSCOPIC
Bilirubin Urine: NEGATIVE
GLUCOSE, UA: NEGATIVE mg/dL
Hgb urine dipstick: NEGATIVE
Ketones, ur: 15 mg/dL — AB
Nitrite: NEGATIVE
Protein, ur: NEGATIVE mg/dL
Specific Gravity, Urine: 1.025 (ref 1.005–1.030)
pH: 7 (ref 5.0–8.0)

## 2016-09-03 LAB — CBC
HCT: 33.5 % — ABNORMAL LOW (ref 36.0–46.0)
Hemoglobin: 11.3 g/dL — ABNORMAL LOW (ref 12.0–15.0)
MCH: 27 pg (ref 26.0–34.0)
MCHC: 33.7 g/dL (ref 30.0–36.0)
MCV: 80.1 fL (ref 78.0–100.0)
Platelets: 381 10*3/uL (ref 150–400)
RBC: 4.18 MIL/uL (ref 3.87–5.11)
RDW: 13.9 % (ref 11.5–15.5)
WBC: 15.4 10*3/uL — AB (ref 4.0–10.5)

## 2016-09-03 LAB — URINE MICROSCOPIC-ADD ON

## 2016-09-03 LAB — PREGNANCY, URINE: PREG TEST UR: POSITIVE — AB

## 2016-09-03 LAB — HCG, QUANTITATIVE, PREGNANCY: HCG, BETA CHAIN, QUANT, S: 45104 m[IU]/mL — AB (ref <5)

## 2016-09-03 LAB — LIPASE, BLOOD: LIPASE: 20 U/L (ref 11–51)

## 2016-09-03 MED ORDER — NITROFURANTOIN MONOHYD MACRO 100 MG PO CAPS: 100.0000 mg | ORAL_CAPSULE | Freq: Two times a day (BID) | ORAL | 0 refills | Status: AC

## 2016-09-03 MED ORDER — ONDANSETRON HCL 4 MG/2ML IJ SOLN
4.0000 mg | Freq: Once | INTRAMUSCULAR | Status: AC
  Administered 2016-09-03: 4 mg via INTRAVENOUS
  Filled 2016-09-03: qty 2

## 2016-09-03 NOTE — Discharge Instructions (Addendum)
You had a positive pregnancy test here in the ED. This may be contributing to your current symptoms. You should follow up with OB/GYN as soon as possible. There was also bacteria noted in your urine. Due to the positive pregnancy test, we will treat this with antibiotics.

## 2016-09-03 NOTE — ED Provider Notes (Signed)
[facility]-EMERGENCY DEPT Provider Note   CSN: [id number] Arrival date & time: 09/03/16  1352     History   Chief Complaint Chief Complaint  Patient presents with  . Anxiety  . Emesis    HPI Alicia Pollard is a 25 y.o. female.  HPI   Alicia Pollard is a 25 y.o. female, with a history of Anxiety, presenting to the ED with An increase in her anxiety for the last 3 weeks. Patient states that she will occasionally become so anxious that she gets nauseous and vomits. Last emesis was 2 days ago. Patient adds that she thinks her anxiety may be due to the fact that she recently moved back in with her mother. Patient does not take medications for her anxiety. Frequent sleep disturbance. Currently reports minor anxiety and nausea. Denies SI/HI, A/V hallucinations, frequent alcohol use, or illicit drug use. Denies shortness of breath, chest pain, abdominal pain, diarrhea, fever/chills, or any other complaints.     History reviewed. No pertinent past medical history.  There are no active problems to display for this patient.   History reviewed. No pertinent surgical history.  OB History    Gravida Para Term Preterm AB Living   1             SAB TAB Ectopic Multiple Live Births                   Home Medications    Prior to Admission medications   Medication Sig Start Date End Date Taking? Authorizing Provider  nitrofurantoin, macrocrystal-monohydrate, (MACROBID) 100 MG capsule Take 1 capsule (100 mg total) by mouth 2 (two) times daily. 09/03/16 09/06/16  Anselm PancoastShawn C Chisa Kushner, PA-C    Family History No family history on file.  Social History Social History  Substance Use Topics  . Smoking status: Former Games developermoker  . Smokeless tobacco: Not on file  . Alcohol use No     Allergies   Review of patient's allergies indicates no known allergies.   Review of Systems Review of Systems  Constitutional: Negative for chills and fever.  Respiratory: Negative for shortness of breath.     Cardiovascular: Negative for chest pain.  Gastrointestinal: Positive for nausea. Negative for abdominal pain.  Neurological: Negative for dizziness, syncope, light-headedness, numbness and headaches.  Psychiatric/Behavioral: Positive for sleep disturbance. Negative for suicidal ideas. The patient is nervous/anxious.   All other systems reviewed and are negative.    Physical Exam Updated Vital Signs BP 130/69 (BP Location: Left Arm)   Pulse 89   Temp 97.9 F (36.6 C) (Oral)   Resp 18   Ht 5\' 10"  (1.778 m)   Wt 135.2 kg   LMP 07/31/2016 (Approximate)   SpO2 98%   BMI 42.76 kg/m   Physical Exam  Constitutional: She appears well-developed and well-nourished. No distress.  Patient is resting comfortably on the bed.  HENT:  Head: Normocephalic and atraumatic.  Eyes: Conjunctivae are normal.  Neck: Neck supple.  Cardiovascular: Normal rate, regular rhythm, normal heart sounds and intact distal pulses.   Pulmonary/Chest: Effort normal and breath sounds normal. No respiratory distress.  Abdominal: Soft. There is no tenderness. There is no guarding.  Musculoskeletal: She exhibits no edema.  Lymphadenopathy:    She has no cervical adenopathy.  Neurological: She is alert.  Skin: Skin is warm and dry. She is not diaphoretic.  Psychiatric: She has a normal mood and affect. Her behavior is normal.  Patient does not appear to be anxious currently. Engaged during  the conversation and makes good eye contact.  Nursing note and vitals reviewed.    ED Treatments / Results  Labs (all labs ordered are listed, but only abnormal results are displayed) Labs Reviewed  COMPREHENSIVE METABOLIC PANEL - Abnormal; Notable for the following:       Result Value   Calcium 8.2 (*)    Total Protein 6.4 (*)    All other components within normal limits  CBC - Abnormal; Notable for the following:    WBC 15.4 (*)    Hemoglobin 11.3 (*)    HCT 33.5 (*)    All other components within normal limits   URINALYSIS, ROUTINE W REFLEX MICROSCOPIC (NOT AT [facility]) - Abnormal; Notable for the following:    APPearance CLOUDY (*)    Ketones, ur 15 (*)    Leukocytes, UA SMALL (*)    All other components within normal limits  URINE MICROSCOPIC-ADD ON - Abnormal; Notable for the following:    Squamous Epithelial / LPF 6-30 (*)    Bacteria, UA MANY (*)    All other components within normal limits  PREGNANCY, URINE - Abnormal; Notable for the following:    Preg Test, Ur POSITIVE (*)    All other components within normal limits  HCG, QUANTITATIVE, PREGNANCY - Abnormal; Notable for the following:    hCG, Beta Chain, Quant, S 45,104 (*)    All other components within normal limits  URINE CULTURE  LIPASE, BLOOD    EKG  EKG Interpretation None       Radiology No results found.  Procedures Procedures (including critical care time)  Medications Ordered in ED Medications  ondansetron (ZOFRAN) injection 4 mg (4 mg Intravenous Given 09/03/16 1719)     Initial Impression / Assessment and Plan / ED Course  I have reviewed the triage vital signs and the nursing notes.  Pertinent labs & imaging results that were available during my care of the patient were reviewed by me and considered in my medical decision making (see chart for details).  Clinical Course    Patient presents with complaint of increased anxiety over the last 3 weeks. Nausea addressed. Pregnancy test is positive. Patient denies urinary complaints, vaginal discharge, vaginal bleeding. Bacteriuria noted on UA. Plan to treat due to patient's positive pregnancy test. Patient was notified of these lab findings. States she will follow-up with OB/GYN. Return precautions discussed.    Vitals:   09/03/16 1422 09/03/16 1423 09/03/16 1746  BP: 130/69  (!) 113/54  Pulse: 89  73  Resp: 18  18  Temp: 97.9 F (36.6 C)  97.9 F (36.6 C)  TempSrc: Oral  Oral  SpO2: 98%  100%  Weight:  135.2 kg   Height:  5\' 10"  (1.778 m)       Final Clinical Impressions(s) / ED Diagnoses   Final diagnoses:  Pregnancy, unspecified gestational age    [place] Prescriptions   NITROFURANTOIN, MACROCRYSTAL-MONOHYDRATE, (MACROBID) 100 MG CAPSULE    Take 1 capsule (100 mg total) by mouth 2 (two) times daily.     Anselm PancoastShawn C Emmalea Treanor, PA-C 09/03/16 1856    Lyndal Pulleyaniel Knott, MD 09/04/16 [phone]

## 2016-09-03 NOTE — ED Notes (Signed)
Bed: [id number] Expected date:  Expected time:  Means of arrival:  Comments: Awaiting EVS

## 2016-09-03 NOTE — ED Triage Notes (Signed)
Pt presents with c/o vomiting and nausea. Pt reports that she has taken a pregnancy test, and although the results were negative, she is unsure as to what else could be going on. Pt also reports she has some anxiety and is not sure if this is also contributing to her symptoms. Pt denies SI.

## 2016-09-03 NOTE — Progress Notes (Signed)
Patient listed a shaving [place] access insurance without a pcp.  Pcp listed on patient's insurance card is located at the [facility].  [facility].  [facility] just hasn't been there in a while.  [facility] to make follow up appointment following ED visit.  Patient verbalized understanding.  No further EDCM needs at this time.

## 2016-09-05 LAB — URINE CULTURE

## 2016-09-06 ENCOUNTER — Encounter (HOSPITAL_COMMUNITY): Payer: Self-pay | Admitting: *Deleted

## 2016-09-06 ENCOUNTER — Emergency Department (HOSPITAL_COMMUNITY)
Admission: EM | Admit: 2016-09-06 | Discharge: 2016-09-06 | Disposition: A | Discharge status: Home / Self Care | Payer: Self-pay | Attending: Emergency Medicine | Admitting: Emergency Medicine

## 2016-09-06 ENCOUNTER — Emergency Department (HOSPITAL_COMMUNITY): Payer: Self-pay

## 2016-09-06 DIAGNOSIS — Z87891 Personal history of nicotine dependence: Secondary | ICD-10-CM | POA: Insufficient documentation

## 2016-09-06 DIAGNOSIS — R112 Nausea with vomiting, unspecified: Secondary | ICD-10-CM

## 2016-09-06 DIAGNOSIS — Z3491 Encounter for supervision of normal pregnancy, unspecified, first trimester: Secondary | ICD-10-CM

## 2016-09-06 DIAGNOSIS — O219 Vomiting of pregnancy, unspecified: Secondary | ICD-10-CM | POA: Insufficient documentation

## 2016-09-06 DIAGNOSIS — Z3A08 8 weeks gestation of pregnancy: Secondary | ICD-10-CM | POA: Insufficient documentation

## 2016-09-06 DIAGNOSIS — R109 Unspecified abdominal pain: Secondary | ICD-10-CM | POA: Insufficient documentation

## 2016-09-06 LAB — CBC
HEMATOCRIT: 34.7 % — AB (ref 36.0–46.0)
HEMOGLOBIN: 11.6 g/dL — AB (ref 12.0–15.0)
MCH: 27.3 pg (ref 26.0–34.0)
MCHC: 33.4 g/dL (ref 30.0–36.0)
MCV: 81.6 fL (ref 78.0–100.0)
Platelets: 427 10*3/uL — ABNORMAL HIGH (ref 150–400)
RBC: 4.25 MIL/uL (ref 3.87–5.11)
RDW: 14 % (ref 11.5–15.5)
WBC: 16.4 10*3/uL — AB (ref 4.0–10.5)

## 2016-09-06 LAB — BASIC METABOLIC PANEL
ANION GAP: 7 (ref 5–15)
BUN: 5 mg/dL — ABNORMAL LOW (ref 6–20)
CALCIUM: 8.6 mg/dL — AB (ref 8.9–10.3)
CHLORIDE: 106 mmol/L (ref 101–111)
CO2: 25 mmol/L (ref 22–32)
CREATININE: 0.82 mg/dL (ref 0.44–1.00)
GFR calc non Af Amer: >60 mL/min (ref >60)
Glucose, Bld: 106 mg/dL — ABNORMAL HIGH (ref 65–99)
Potassium: 3.4 mmol/L — ABNORMAL LOW (ref 3.5–5.1)
SODIUM: 138 mmol/L (ref 135–145)

## 2016-09-06 LAB — URINALYSIS, ROUTINE W REFLEX MICROSCOPIC
BILIRUBIN URINE: NEGATIVE
Glucose, UA: NEGATIVE mg/dL
Hgb urine dipstick: NEGATIVE
KETONES UR: 15 mg/dL — AB
LEUKOCYTES UA: NEGATIVE
NITRITE: NEGATIVE
PH: 8 (ref 5.0–8.0)
Protein, ur: NEGATIVE mg/dL
Specific Gravity, Urine: 1.023 (ref 1.005–1.030)

## 2016-09-06 LAB — POC OCCULT BLOOD, ED: FECAL OCCULT BLD: NEGATIVE

## 2016-09-06 MED ORDER — POTASSIUM CHLORIDE CRYS ER 20 MEQ PO TBCR
20.0000 meq | EXTENDED_RELEASE_TABLET | Freq: Once | ORAL | Status: AC
  Administered 2016-09-06: 20 meq via ORAL
  Filled 2016-09-06: qty 1

## 2016-09-06 NOTE — ED Notes (Signed)
Pt given water, gingerale and crackers.  

## 2016-09-06 NOTE — Discharge Instructions (Signed)
It was our pleasure to provide your ER care today - we hope that you feel better.  Follow up with ob/gyn doctor in the next 1-2 weeks - call office to arrange appointment.  Rest. Drink plenty of fluids.  Your potassium level is slightly low today (3.4) - eat plenty of fruits and vegetables.   Return to ER if worse, new symptoms, persistent vomiting, severe abdominal pain, fevers, other concern.

## 2016-09-06 NOTE — ED Provider Notes (Signed)
[facility]-EMER[facility] DEPT Provider Note   CSN: [id number] Arrival date & time: 09/06/16  1343     History   Chief Complaint Chief Complaint  Patient presents with  . Hematemesis    HPI Alicia Pollard is a 25 y.o. female.  Patient indicates had episodes of nausea and vomiting earlier today, associated with abd cramping, diffuse.  Emesis occurred after eating fettuccini alfredo. States then had episode of emesis with ?blood in it.  Had normal bm today, brown. No recent black or bloody bms. No hx gi bleeding. States had positive pregnancy test earlier this month, lnmp mid September.  Denies any vaginal bleeding or discharge. No back or flank pain. No fever or chills.      The history is provided by the patient.    History reviewed. No pertinent past medical history.  There are no active problems to display for this patient.   History reviewed. No pertinent surgical history.  OB History    Gravida Para Term Preterm AB Living   1             SAB TAB Ectopic Multiple Live Births                   Home Medications    Prior to Admission medications   Medication Sig Start Date End Date Taking? Authorizing Provider  nitrofurantoin, macrocrystal-monohydrate, (MACROBID) 100 MG capsule Take 1 capsule (100 mg total) by mouth 2 (two) times daily. Patient taking differently: Take 100 mg by mouth 2 (two) times daily. Started 11/03 for 3 days 09/03/16 09/06/16 Yes Shawn C Joy, PA-C    Family History No family history on file.  Social History Social History  Substance Use Topics  . Smoking status: Former Games developer  . Smokeless tobacco: Never Used  . Alcohol use No     Allergies   Review of patient's allergies indicates no known allergies.   Review of Systems Review of Systems  Constitutional: Negative for fever.  HENT: Negative for sore throat.   Eyes: Negative for redness.  Respiratory: Negative for cough and shortness of breath.   Cardiovascular: Negative for leg  swelling.  Gastrointestinal: Positive for vomiting.  Genitourinary: Negative for dysuria, flank pain, vaginal bleeding and vaginal discharge.  Musculoskeletal: Negative for back pain and neck pain.  Skin: Negative for rash.  Neurological: Negative for headaches.  Hematological: Does not bruise/bleed easily.  Psychiatric/Behavioral: Negative for confusion.     Physical Exam Updated Vital Signs BP 126/73 (BP Location: Left Arm)   Pulse 62   Temp 98.4 F (36.9 C) (Oral)   Resp 17   Ht 5\' 10"  (1.778 m)   Wt 135.2 kg   LMP 07/31/2016 (Approximate)   SpO2 100%   BMI 42.76 kg/m   Physical Exam  Constitutional: She appears well-developed and well-nourished. No distress.  HENT:  Mouth/Throat: Oropharynx is clear and moist.  Eyes: Conjunctivae are normal. No scleral icterus.  Neck: Neck supple. No tracheal deviation present.  Cardiovascular: Normal rate, regular rhythm, normal heart sounds and intact distal pulses.   No murmur heard. Pulmonary/Chest: Effort normal and breath sounds normal. No respiratory distress.  Abdominal: Soft. Normal appearance and bowel sounds are normal. She exhibits no distension and no mass. There is no tenderness. There is no rebound and no guarding. No hernia.  obese  Genitourinary:  Genitourinary Comments: No cva tenderness  Musculoskeletal: She exhibits no edema.  Neurological: She is alert.  Skin: Skin is warm and dry. No rash noted. She  is not diaphoretic. No pallor.  Psychiatric: She has a normal mood and affect.  Nursing note and vitals reviewed.    ED Treatments / Results  Labs (all labs ordered are listed, but only abnormal results are displayed) Results for orders placed or performed during the hospital encounter of 09/06/16  CBC  Result Value Ref Range   WBC 16.4 (H) 4.0 - 10.5 K/uL   RBC 4.25 3.87 - 5.11 MIL/uL   Hemoglobin 11.6 (L) 12.0 - 15.0 g/dL   HCT 16.134.7 (L) 09.636.0 - 04.546.0 %   MCV 81.6 78.0 - 100.0 fL   MCH 27.3 26.0 - 34.0 pg     MCHC 33.4 30.0 - 36.0 g/dL   RDW 40.914.0 81.111.5 - 91.415.5 %   Platelets 427 (H) 150 - 400 K/uL  Basic metabolic panel  Result Value Ref Range   Sodium 138 135 - 145 mmol/L   Potassium 3.4 (L) 3.5 - 5.1 mmol/L   Chloride 106 101 - 111 mmol/L   CO2 25 22 - 32 mmol/L   Glucose, Bld 106 (H) 65 - 99 mg/dL   BUN 5 (L) 6 - 20 mg/dL   Creatinine, Ser 7.820.82 0.44 - 1.00 mg/dL   Calcium 8.6 (L) 8.9 - 10.3 mg/dL   GFR calc non Af Amer >60 >60 mL/min   GFR calc Af Amer >60 >60 mL/min   Anion gap 7 5 - 15  Urinalysis, Routine w reflex microscopic (not at [facility])  Result Value Ref Range   Color, Urine AMBER (A) YELLOW   APPearance CLEAR CLEAR   Specific Gravity, Urine 1.023 1.005 - 1.030   pH 8.0 5.0 - 8.0   Glucose, UA NEGATIVE NEGATIVE mg/dL   Hgb urine dipstick NEGATIVE NEGATIVE   Bilirubin Urine NEGATIVE NEGATIVE   Ketones, ur 15 (A) NEGATIVE mg/dL   Protein, ur NEGATIVE NEGATIVE mg/dL   Nitrite NEGATIVE NEGATIVE   Leukocytes, UA NEGATIVE NEGATIVE   [place] Ob Comp Less 14 Wks  Result Date: 09/06/2016 CLINICAL DATA:  Vomiting blood. EXAM: OBSTETRIC <14 WK US AND TRANSVAGINAL OB US TECHNIQUE: Both transabdominal and transvaginal ultrasound examinations were performed for complete evaluation of the gestation as well as the maternal uterus, adnexal regions, and pelvic cul-de-sac. Transvaginal technique was performed to assess early pregnancy. COMPARISON:  None. FINDINGS: Intrauterine gestational sac: Single Yolk sac:  Yes Embryo:  Yes Cardiac Activity: Yes Heart Rate: 117  bpm CRL:  8  mm   6 w   5 d                  US EDC: 04/27/2017 Subchorionic hemorrhage:  None visualized. Maternal uterus/adnexae: Right ovary: Normal Left ovary: Normal Other :None Free fluid:  None IMPRESSION: 1. Single living intrauterine gestation. The estimated gestational age is 6 weeks and 5 days. 2. No complicating features identified. Electronically Signed   By: Signa Kellaylor  Stroud M.D.   On: 09/06/2016 15:53   [place] Ob  Transvaginal  Result Date: 09/06/2016 CLINICAL DATA:  Vaginal bleeding with positive pregnancy test EXAM: OBSTETRIC <14 WK US AND TRANSVAGINAL OB US TECHNIQUE: Both transabdominal and transvaginal ultrasound examinations were performed for complete evaluation of the gestation as well as the maternal uterus, adnexal regions, and pelvic cul-de-sac. Transvaginal technique was performed to assess early pregnancy. COMPARISON:  None. FINDINGS: Intrauterine gestational sac: Present Yolk sac:  Present Embryo:  Present Cardiac Activity: Present Heart Rate: 117  bpm CRL:  8  mm   6 w   5 d  US EDC: 04/27/2017 Subchorionic hemorrhage:  None visualized. Maternal uterus/adnexae: Follicular changes are noted in the ovaries bilaterally. Dominant cyst is noted on the left measuring 1.9 cm. IMPRESSION: Single live intrauterine gestation at 6 weeks 5 days. No acute abnormality is noted. Electronically Signed   By: Alcide CleverMark  Lukens M.D.   On: 09/06/2016 15:48    EKG  EKG Interpretation None       Radiology No results found.  Procedures Procedures (including critical care time)  Medications Ordered in ED Medications - No data to display   Initial Impression / Assessment and Plan / ED Course  I have reviewed the triage vital signs and the nursing notes.  Pertinent labs & imaging results that were available during my care of the patient were reviewed by me and considered in my medical decision making (see chart for details).  Clinical Course   Labs.   Po fluids.  Reviewed nursing notes and prior charts for additional history.   kcl po.  Tolerating po fluids/food.   Recheck, no recurrent emesis. abd soft nt.   Stool heme neg. No emesis.   Patient current appears stable for d/c.     Final Clinical Impressions(s) / ED Diagnoses   Final diagnoses:  Abdominal cramping    New Prescriptions New Prescriptions   No medications on file     Cathren LaineKevin Yer Olivencia, MD 09/06/16 1712

## 2016-09-06 NOTE — ED Notes (Signed)
Patient transported to Ultrasound 

## 2016-09-06 NOTE — ED Triage Notes (Addendum)
Patient states she is taking Macrobid for a UTI as prescribed here on 10/31.  Patient states she was also told she is pregnant on 10/31.  Patient states she has struggles with N/V over past few days and that seems to be exacerbated by the Macrobid.  Patient states despite eating with the Macrobid, "I throw up every time I take it."  Patient states today she vomited blood after eating lunch.  Patient c/o RLQ pain that began after she vomited blood, which she associates with the force of her vomiting.  Patient denies fever, vaginal discharge and bleeding.  Patient states she continues to have urinary frequency/urgency and mild dysuria.  Patient denies fever.

## 2016-11-04 NOTE — L&D Delivery Note (Signed)
Delivery Note At 12:09 AM a viable female was delivered via Vaginal, Spontaneous Delivery (Presentation: ROA).  APGAR: 8, 9; weight pending.   Placenta status: Spontaneous, intact.  Cord: 3 vessels.  Anesthesia:  epidural Episiotomy: None Lacerations:  None Suture Repair: n/a Est. Blood Loss (mL):    Mom to postpartum.  Baby to Couplet care / Skin to Skin.  Cleone SlimCaroline Esmee Fallaw SNM 04/21/2017, 12:18 AM  Garry HeaterYasmine Delashmit is a 26 y.o. female [phone] with IUP at 2523w1d admitted for induction of labor for GDMA2 on glyburide.  She progressed with cytotec and pitocin augmentation to complete and pushed 2 times to deliver.  Cord clamping delayed by several minutes then clamped by CNM and cut by father of the baby.  Placenta intact and spontaneous, bleeding minimal.  No laceration.  Mom and baby stable prior to transfer to postpartum. She plans on bottle feeding. She requests interval BTL for birth control.

## 2016-12-24 ENCOUNTER — Other Ambulatory Visit: Payer: Self-pay

## 2016-12-24 MED ORDER — PRENATAL PLUS 27-1 MG PO TABS: 1.0000 | ORAL_TABLET | Freq: Every day | ORAL | 12 refills | Status: DC

## 2016-12-24 NOTE — Progress Notes (Signed)
Patient called for New OB appointment. Patient given prenatal vitamins to start taking. Patient LMP 07-26-16 (approx 22 weeks) with no prenatal care up to this point. Alicia StammerJennifer Howard RNBSN

## 2016-12-27 ENCOUNTER — Encounter (HOSPITAL_COMMUNITY): Payer: Self-pay

## 2016-12-27 ENCOUNTER — Inpatient Hospital Stay (HOSPITAL_COMMUNITY)
Admission: AD | Admit: 2016-12-27 | Discharge: 2016-12-27 | Disposition: A | Discharge status: Home / Self Care | Payer: Medicaid Other | Source: Ambulatory Visit | Attending: Obstetrics and Gynecology | Admitting: Obstetrics and Gynecology

## 2016-12-27 DIAGNOSIS — O23592 Infection of other part of genital tract in pregnancy, second trimester: Secondary | ICD-10-CM | POA: Insufficient documentation

## 2016-12-27 DIAGNOSIS — Z3A22 22 weeks gestation of pregnancy: Secondary | ICD-10-CM | POA: Diagnosis not present

## 2016-12-27 DIAGNOSIS — R1084 Generalized abdominal pain: Secondary | ICD-10-CM | POA: Diagnosis not present

## 2016-12-27 DIAGNOSIS — B9689 Other specified bacterial agents as the cause of diseases classified elsewhere: Secondary | ICD-10-CM | POA: Diagnosis not present

## 2016-12-27 DIAGNOSIS — N760 Acute vaginitis: Secondary | ICD-10-CM

## 2016-12-27 DIAGNOSIS — R1030 Lower abdominal pain, unspecified: Secondary | ICD-10-CM | POA: Diagnosis present

## 2016-12-27 DIAGNOSIS — Z87891 Personal history of nicotine dependence: Secondary | ICD-10-CM | POA: Diagnosis not present

## 2016-12-27 LAB — URINALYSIS, ROUTINE W REFLEX MICROSCOPIC
Bilirubin Urine: NEGATIVE
Glucose, UA: NEGATIVE mg/dL
Hgb urine dipstick: NEGATIVE
Ketones, ur: NEGATIVE mg/dL
Nitrite: NEGATIVE
Protein, ur: NEGATIVE mg/dL
Specific Gravity, Urine: 1.019 (ref 1.005–1.030)
pH: 8 (ref 5.0–8.0)

## 2016-12-27 LAB — WET PREP, GENITAL
Sperm: NONE SEEN
Trich, Wet Prep: NONE SEEN
Yeast Wet Prep HPF POC: NONE SEEN

## 2016-12-27 MED ORDER — METRONIDAZOLE 250 MG PO TABS: 250.0000 mg | ORAL_TABLET | Freq: Three times a day (TID) | ORAL | 0 refills | Status: AC

## 2016-12-27 NOTE — MAU Note (Signed)
Notified Dr. Abelardo DieselMcMullen patient's wet prep results are back.

## 2016-12-27 NOTE — Discharge Instructions (Signed)
Round Ligament Pain Introduction The round ligament is a cord of muscle and tissue that helps to support the uterus. It can become a source of pain during pregnancy if it becomes stretched or twisted as the baby grows. The pain usually begins in the second trimester of pregnancy, and it can come and go until the baby is delivered. It is not a serious problem, and it does not cause harm to the baby. Round ligament pain is usually a short, sharp, and pinching pain, but it can also be a dull, lingering, and aching pain. The pain is felt in the lower side of the abdomen or in the groin. It usually starts deep in the groin and moves up to the outside of the hip area. Pain can occur with:  A sudden change in position.  Rolling over in bed.  Coughing or sneezing.  Physical activity. Follow these instructions at home: Watch your condition for any changes. Take these steps to help with your pain:  When the pain starts, relax. Then try:  Sitting down.  Flexing your knees up to your abdomen.  Lying on your side with one pillow under your abdomen and another pillow between your legs.  Sitting in a warm bath for 15-20 minutes or until the pain goes away.  Take over-the-counter and prescription medicines only as told by your health care provider.  Move slowly when you sit and stand.  Avoid long walks if they cause pain.  Stop or lessen your physical activities if they cause pain. Contact a health care provider if:  Your pain does not go away with treatment.  You feel pain in your back that you did not have before.  Your medicine is not helping. Get help right away if:  You develop a fever or chills.  You develop uterine contractions.  You develop vaginal bleeding.  You develop nausea or vomiting.  You develop diarrhea.  You have pain when you urinate. This information is not intended to replace advice given to you by your health care provider. Make sure you discuss any questions  you have with your health care provider. Document Released: 07/30/2008 Document Revised: 03/28/2016 Document Reviewed: 12/28/2014  2017 Elsevier  

## 2016-12-27 NOTE — MAU Note (Signed)
Notified resident patient presents with no prenatal care other than an initial ultrasound in November which dates her currently at 6541w5d, c/o lower abdominal pain for a couple of weeks, discharge with itching.

## 2016-12-27 NOTE — MAU Note (Signed)
Patient presents with no prenatal care, had US in November which makes her 5937w5d, having abdominal pain x 2 weeks, lower abdominal most of the time but sometimes radiates to left upper abdominal area.

## 2016-12-27 NOTE — MAU Provider Note (Signed)
Chief Complaint: Abdominal Pain   First Provider Initiated Contact with Patient 12/27/16 1001     SUBJECTIVE HPI: Alicia Pollard is a 26 y.o. G5P2020 at 8518w5d who presents to [facility] Admissions reporting b/l lower abdominal pain of 1 month duration.  Location: b/l lower abd Quality: sharp Severity: 8/10 in pain scale Duration: intermitent Context: pregnant, non-positional Timing: 1 month ago Modifying factors: none, tylenol w/o relief Associated signs and symptoms: none  Denies contractions, leakage of fluid or vaginal bleeding. Good fetal movement.   Pregnancy Course:   No past medical history on file. OB History  Gravida Para Term Preterm AB Living  5 2 2   2     SAB TAB Ectopic Multiple Live Births    2     2    # Outcome Date GA Lbr Len/2nd Weight Sex Delivery Anes PTL Lv  5 Current           4 TAB           3 TAB           2 Term           1 Term              No past surgical history on file. No family history on file. Social History  Substance Use Topics  . Smoking status: Former Games developermoker  . Smokeless tobacco: Never Used  . Alcohol use No   No Known Allergies Prescriptions Prior to Admission  Medication Sig Dispense Refill Last Dose  . prenatal vitamin w/FE, FA (PRENATAL 1 + 1) 27-1 MG TABS tablet Take 1 tablet by mouth daily at 12 noon. (Patient not taking: Reported on 12/27/2016) 30 each 12 Not Taking at Unknown time    I have reviewed patient's Past Medical Hx, Surgical Hx, Family Hx, Social Hx, medications and allergies.   ROS:  Review of Systems  Constitutional: Negative for chills, diaphoresis and fatigue.  Respiratory: Negative for cough, chest tightness, shortness of breath and wheezing.   Cardiovascular: Negative for chest pain, palpitations and leg swelling.  Gastrointestinal: Positive for abdominal pain. Negative for blood in stool, constipation, diarrhea, nausea and vomiting.  Genitourinary: Negative for dysuria, frequency, hematuria, pelvic  pain, vaginal bleeding and vaginal discharge.  Neurological: Negative for weakness and headaches.    Physical Exam   Patient Vitals for the past 24 hrs:  BP Temp Temp src Pulse Resp  12/27/16 0850 120/88 98 F (36.7 C) Oral 98 18   Constitutional: Well-developed, well-nourished female in no acute distress.  Cardiovascular: normal rate Respiratory: normal effort GI: Abd soft, non-tender, gravid appropriate for gestational age. Pos BS x 4 MS: Extremities nontender, no edema, normal ROM Neurologic: Alert and oriented x 4.  GU: Neg CVAT.  Pelvic: NEFG, physiologic discharge, no blood, cervix clean. No CMT    Contractions: none   Labs: Results for orders placed or performed during the hospital encounter of 12/27/16 (from the past 24 hour(s))  Urinalysis, Routine w reflex microscopic (not [facility])     Status: Abnormal   Collection Time: 12/27/16  8:35 AM  Result Value Ref Range   Color, Urine YELLOW YELLOW   APPearance HAZY (A) CLEAR   Specific Gravity, Urine 1.019 1.005 - 1.030   pH 8.0 5.0 - 8.0   Glucose, UA NEGATIVE NEGATIVE mg/dL   Hgb urine dipstick NEGATIVE NEGATIVE   Bilirubin Urine NEGATIVE NEGATIVE   Ketones, ur NEGATIVE NEGATIVE mg/dL   Protein, ur NEGATIVE NEGATIVE mg/dL  Nitrite NEGATIVE NEGATIVE   Leukocytes, UA TRACE (A) NEGATIVE   RBC / HPF 0-5 0 - 5 RBC/hpf   WBC, UA 0-5 0 - 5 WBC/hpf   Bacteria, UA RARE (A) NONE SEEN   Squamous Epithelial / LPF 0-5 (A) NONE SEEN   Mucous PRESENT   Wet prep, genital     Status: Abnormal   Collection Time: 12/27/16 10:10 AM  Result Value Ref Range   Yeast Wet Prep HPF POC NONE SEEN NONE SEEN   Trich, Wet Prep NONE SEEN NONE SEEN   Clue Cells Wet Prep HPF POC PRESENT (A) NONE SEEN   WBC, Wet Prep HPF POC FEW (A) NONE SEEN   Sperm NONE SEEN     Imaging:  No results found.  MAU Course: Orders Placed This Encounter  Procedures  . Wet prep, genital  . Urinalysis, Routine w reflex microscopic (not at [facility])   Meds  ordered this encounter  Medications  . metroNIDAZOLE (FLAGYL) 250 MG tablet    Sig: Take 1 tablet (250 mg total) by mouth 3 (three) times daily.    Dispense:  21 tablet    Refill:  0    MDM:  Assessment: No diagnosis found.  Plan: UA negative for infection. Wet prep with few WBC and clue cells present. No signs of active labor or ROM. No bleeding. Likely round ligament pain. Discharge home in stable condition.  --Instructed to pregnancy support band due to round ligament pain --Flagyl 250 mg q8h x7 days   Allergies as of 12/27/2016   No Known Allergies     Medication List    TAKE these medications   metroNIDAZOLE 250 MG tablet Commonly known as:  FLAGYL Take 1 tablet (250 mg total) by mouth 3 (three) times daily.   prenatal vitamin w/FE, FA 27-1 MG Tabs tablet Take 1 tablet by mouth daily at 12 noon.       Alicia Beavers, DO, PGY-1 12/27/2016, 10:48 AM   OB FELLOW DISCHARGE ATTESTATION  I have seen and examined this patient and agree with above documentation in the resident's note.   Patient is  26 y/o G5P2 @ 22wks and 5 days who presents with lower abdominal pain. She also has some vaginal itching and discharge. Pain has been present for one month. It is intermittent with no pattern.  Gen: well appearing NAD, obeese Pulm: no respiratory distress, no accessory muscle use CV: regular rate, distal pulses intact GU: external genitallia normal, no discarge noted Ext 1+ edam Abdomen: Gravid with approximately 22 wk uterus, no pain with palapation  A/P: Vaginal Discharge: patient with bacterial vaginosis plan for flagyl Abdominal Pain: round ligament pain.    Alicia Penna, MD 11:23 AM

## 2016-12-30 LAB — GC/CHLAMYDIA PROBE AMP (~~LOC~~) NOT AT ARMC
CHLAMYDIA, DNA PROBE: NEGATIVE
NEISSERIA GONORRHEA: NEGATIVE

## 2017-01-24 ENCOUNTER — Other Ambulatory Visit (HOSPITAL_COMMUNITY)
Admission: RE | Admit: 2017-01-24 | Discharge: 2017-01-24 | Disposition: A | Discharge status: Home / Self Care | Payer: Medicaid Other | Source: Ambulatory Visit | Attending: Family Medicine | Admitting: Family Medicine

## 2017-01-24 ENCOUNTER — Encounter: Payer: Self-pay | Admitting: Family Medicine

## 2017-01-24 ENCOUNTER — Ambulatory Visit (INDEPENDENT_AMBULATORY_CARE_PROVIDER_SITE_OTHER): Payer: Medicaid Other | Admitting: Family Medicine

## 2017-01-24 DIAGNOSIS — E669 Obesity, unspecified: Secondary | ICD-10-CM | POA: Insufficient documentation

## 2017-01-24 DIAGNOSIS — Z3A26 26 weeks gestation of pregnancy: Secondary | ICD-10-CM | POA: Diagnosis not present

## 2017-01-24 DIAGNOSIS — O0932 Supervision of pregnancy with insufficient antenatal care, second trimester: Secondary | ICD-10-CM | POA: Insufficient documentation

## 2017-01-24 DIAGNOSIS — Z3490 Encounter for supervision of normal pregnancy, unspecified, unspecified trimester: Secondary | ICD-10-CM | POA: Insufficient documentation

## 2017-01-24 DIAGNOSIS — Z3482 Encounter for supervision of other normal pregnancy, second trimester: Secondary | ICD-10-CM

## 2017-01-24 NOTE — Progress Notes (Signed)
  Subjective:    Alicia Pollard is a [id number] 2388w5d being seen today for her first obstetrical visit.  Her obstetrical history is significant for obesity. Patient does not intend to breast feed. Pregnancy history fully reviewed.  Patient reports no complaints.  Vitals:   01/24/17 0909  BP: 118/81  Pulse: 100  Weight: (!) 320 lb (145.2 kg)    HISTORY: OB History  Gravida Para Term Preterm AB Living  5 2 2   2 1   SAB TAB Ectopic Multiple Live Births    2     2    # Outcome Date GA Lbr Len/2nd Weight Sex Delivery Anes PTL Lv  5 Current           4 Term 12/24/14     Vag-Spont   LIV  3 Term 10/04/12     Vag-Spont     2 TAB           1 TAB              History reviewed. No pertinent past medical history. Past Surgical History:  Procedure Laterality Date  . NO PAST SURGERIES     Family History  Problem Relation Age of Onset  . Asthma Sister   . Diabetes Maternal Aunt   . Hypertension Maternal Aunt   . Hypertension Maternal Grandmother   . Cancer Neg Hx   . Stroke Neg Hx      Exam    Uterus:     Pelvic Exam:    Perineum: No Hemorrhoids, Normal Perineum   Vulva: Bartholin's, Urethra, Skene's normal   Vagina:  normal mucosa   Cervix: multiparous appearance and no bleeding following Pap  System: Breast:  normal appearance, no masses or tenderness, Inspection negative, No nipple retraction or dimpling, No nipple discharge or bleeding, No axillary or supraclavicular adenopathy   Skin: normal coloration and turgor, no rashes    Neurologic: gait normal; reflexes normal and symmetric   Extremities: normal strength, tone, and muscle mass   HEENT PERRLA and extra ocular movement intact   Mouth/Teeth mucous membranes moist, pharynx normal without lesions   Neck supple and no masses   Cardiovascular: regular rate and rhythm, no murmurs or gallops   Respiratory:  appears well, vitals normal, no respiratory distress, acyanotic, normal RR, ear and throat exam is normal, neck  free of mass or lymphadenopathy, chest clear, no wheezing, crepitations, rhonchi, normal symmetric air entry   Abdomen: soft, non-tender; bowel sounds normal; no masses,  no organomegaly   Urinary: urethral meatus normal      Assessment:    Pregnancy: [id number] There are no active problems to display for this patient.       Plan:      1. Encounter for supervision of other normal pregnancy in second trimester Initial labs drawn. Prenatal vitamins. Problem list reviewed and updated. Genetic Screening: late to care 2hr GTT with prenatal labs on monday - US MFM OB COMP + 14 WK; Future - Cytology - PAP   Follow up in 4 weeks. 50% of 30 min visit spent on counseling and coordination of care.     Levie HeritageJacob J Stinson 01/24/2017

## 2017-01-24 NOTE — Progress Notes (Signed)
Medicaid Tubal consent form signed with patient. Armandina StammerJennifer Jerik Falletta RNBSN

## 2017-01-27 ENCOUNTER — Other Ambulatory Visit: Payer: Medicaid Other

## 2017-01-27 DIAGNOSIS — Z3490 Encounter for supervision of normal pregnancy, unspecified, unspecified trimester: Secondary | ICD-10-CM

## 2017-01-27 LAB — CYTOLOGY - PAP: Diagnosis: NEGATIVE

## 2017-01-27 NOTE — Progress Notes (Signed)
Labs to be drawn with [facility]. Armandina StammerJennifer Howard RNBSN

## 2017-01-28 ENCOUNTER — Other Ambulatory Visit: Payer: Self-pay | Admitting: Family Medicine

## 2017-01-28 DIAGNOSIS — Z3482 Encounter for supervision of other normal pregnancy, second trimester: Secondary | ICD-10-CM

## 2017-01-28 DIAGNOSIS — O24419 Gestational diabetes mellitus in pregnancy, unspecified control: Secondary | ICD-10-CM

## 2017-01-28 LAB — OBSTETRIC PANEL, INCLUDING HIV
ANTIBODY SCREEN: NEGATIVE
BASOS: 0 %
Basophils Absolute: 0 10*3/uL (ref 0.0–0.2)
EOS (ABSOLUTE): 0.5 10*3/uL — ABNORMAL HIGH (ref 0.0–0.4)
Eos: 4 %
HEMATOCRIT: 30 % — AB (ref 34.0–46.6)
HEP B S AG: NEGATIVE
HIV SCREEN 4TH GENERATION: NONREACTIVE
Hemoglobin: 9.6 g/dL — ABNORMAL LOW (ref 11.1–15.9)
Immature Grans (Abs): 0 10*3/uL (ref 0.0–0.1)
Immature Granulocytes: 0 %
Lymphocytes Absolute: 2 10*3/uL (ref 0.7–3.1)
Lymphs: 17 %
MCH: 26 pg — AB (ref 26.6–33.0)
MCHC: 32 g/dL (ref 31.5–35.7)
MCV: 81 fL (ref 79–97)
MONOCYTES: 6 %
Monocytes Absolute: 0.7 10*3/uL (ref 0.1–0.9)
NEUTROS ABS: 8.4 10*3/uL — AB (ref 1.4–7.0)
Neutrophils: 73 %
Platelets: 435 10*3/uL — ABNORMAL HIGH (ref 150–379)
RBC: 3.69 x10E6/uL — ABNORMAL LOW (ref 3.77–5.28)
RDW: 13.9 % (ref 12.3–15.4)
RH TYPE: POSITIVE
RPR: NONREACTIVE
WBC: 11.6 10*3/uL — ABNORMAL HIGH (ref 3.4–10.8)

## 2017-01-28 LAB — GLUCOSE TOLERANCE, 2 HOURS W/ 1HR
Glucose, 1 hour: 118 mg/dL (ref 65–179)
Glucose, 2 hour: 100 mg/dL (ref 65–152)
Glucose, Fasting: 92 mg/dL — ABNORMAL HIGH (ref 65–91)

## 2017-01-28 MED ORDER — FERROUS SULFATE 325 (65 FE) MG PO TABS: 325.0000 mg | ORAL_TABLET | Freq: Two times a day (BID) | ORAL | 1 refills | Status: DC

## 2017-01-28 MED ORDER — GLUCOSE BLOOD VI STRP: ORAL_STRIP | 12 refills | Status: DC

## 2017-01-28 MED ORDER — ACCU-CHEK FASTCLIX LANCETS MISC: 1.0000 [IU] | Freq: Four times a day (QID) | 12 refills | Status: DC

## 2017-01-28 MED ORDER — ACCU-CHEK NANO SMARTVIEW W/DEVICE KIT: 1.0000 | PACK | 0 refills | Status: DC

## 2017-01-29 ENCOUNTER — Telehealth: Payer: Self-pay

## 2017-01-29 DIAGNOSIS — R7309 Other abnormal glucose: Secondary | ICD-10-CM

## 2017-01-29 NOTE — Telephone Encounter (Signed)
Patient called and given results of low hemaglobin and the need to start iron twice a day. Patient made aware that script has been sent to [facility] [place].  Also made patient aware that she failed her two hour glucose test. She is now gestation diabetic. Patient made aware that I will place referral for her to be seen at diabetes center for education. We have also sent in the supplies she will need to her pharmacy. Patient instructed that if she has not received an appointment for diabetes education by next week she needs to call me and let me know. Patient sates understanding. [name] RNBSN

## 2017-01-30 ENCOUNTER — Ambulatory Visit (HOSPITAL_COMMUNITY)
Admission: RE | Admit: 2017-01-30 | Discharge: 2017-01-30 | Disposition: A | Discharge status: Home / Self Care | Payer: Medicaid Other | Source: Ambulatory Visit | Attending: Family Medicine | Admitting: Family Medicine

## 2017-01-30 DIAGNOSIS — Z3689 Encounter for other specified antenatal screening: Secondary | ICD-10-CM | POA: Diagnosis present

## 2017-01-30 DIAGNOSIS — Z3A26 26 weeks gestation of pregnancy: Secondary | ICD-10-CM | POA: Insufficient documentation

## 2017-01-30 DIAGNOSIS — Z3482 Encounter for supervision of other normal pregnancy, second trimester: Secondary | ICD-10-CM

## 2017-02-07 ENCOUNTER — Ambulatory Visit (INDEPENDENT_AMBULATORY_CARE_PROVIDER_SITE_OTHER): Payer: Medicaid Other | Admitting: Family Medicine

## 2017-02-07 VITALS — BP 130/55 | HR 104 | Wt 317.0 lb

## 2017-02-07 DIAGNOSIS — O24419 Gestational diabetes mellitus in pregnancy, unspecified control: Secondary | ICD-10-CM | POA: Diagnosis not present

## 2017-02-07 DIAGNOSIS — Z3482 Encounter for supervision of other normal pregnancy, second trimester: Secondary | ICD-10-CM | POA: Diagnosis not present

## 2017-02-07 NOTE — Progress Notes (Signed)
Patient states that she is schedule to see Diabetes management on Monday. Armandina Stammer RNBSN

## 2017-02-07 NOTE — Progress Notes (Signed)
   PRENATAL VISIT NOTE  Subjective:  Alicia Pollard is a 26 y.o. [phone] at [redacted]w[redacted]d being seen today for ongoing prenatal care.  She is currently monitored for the following issues for this high-risk pregnancy and has Supervision of normal pregnancy; Insufficient prenatal care, second trimester; Obesity (BMI 30-39.9); and GDM (gestational diabetes mellitus) on her problem list.  Patient reports no complaints.  Contractions: Not present. Vag. Bleeding: None.  Movement: Present. Denies leaking of fluid.   The following portions of the patient's history were reviewed and updated as appropriate: allergies, current medications, past family history, past medical history, past social history, past surgical history and problem list. Problem list updated.  Objective:   Vitals:   02/07/17 1045  BP: (!) 130/55  Pulse: (!) 104  Weight: (!) 317 lb (143.8 kg)    Fetal Status: Fetal Heart Rate (bpm): 140 Fundal Height: 30 cm Movement: Present     General:  Alert, oriented and cooperative. Patient is in no acute distress.  Skin: Skin is warm and dry. No rash noted.   Cardiovascular: Normal heart rate noted  Respiratory: Normal respiratory effort, no problems with respiration noted  Abdomen: Soft, gravid, appropriate for gestational age. Pain/Pressure: Present     Pelvic:  Cervical exam deferred        Extremities: Normal range of motion.  Edema: None  Mental Status: Normal mood and affect. Normal behavior. Normal judgment and thought content.   Assessment and Plan:  Pregnancy: [id number] at [redacted]w[redacted]d  1. Encounter for supervision of other normal pregnancy in second trimester FHT and Fh normal  2. Gestational diabetes mellitus (GDM) in third trimester, gestational diabetes method of control unspecified Discussed checking CBGs. Hasn't picked up meter yet. Dsicussed goals. F/u in 1 week  Preterm labor symptoms and general obstetric precautions including but not limited to vaginal bleeding, contractions,  leaking of fluid and fetal movement were reviewed in detail with the patient. Please refer to After Visit Summary for other counseling recommendations.  No Follow-up on file.   Levie Heritage, DO

## 2017-02-10 ENCOUNTER — Encounter: Payer: Self-pay | Admitting: Skilled Nursing Facility1

## 2017-02-10 ENCOUNTER — Encounter: Payer: Medicaid Other | Attending: Family Medicine | Admitting: Skilled Nursing Facility1

## 2017-02-10 DIAGNOSIS — Z3A00 Weeks of gestation of pregnancy not specified: Secondary | ICD-10-CM | POA: Diagnosis not present

## 2017-02-10 DIAGNOSIS — O99810 Abnormal glucose complicating pregnancy: Secondary | ICD-10-CM | POA: Diagnosis present

## 2017-02-10 DIAGNOSIS — O24419 Gestational diabetes mellitus in pregnancy, unspecified control: Secondary | ICD-10-CM

## 2017-02-10 NOTE — Progress Notes (Signed)
  Patient was seen on 02/10/17 for Gestational Diabetes self-management class at the [facility]. The following learning objectives were met by the patient during this course:   States the definition of Gestational Diabetes  States why dietary management is important in controlling blood glucose  Describes the effects each nutrient has on blood glucose levels  Demonstrates ability to create a balanced meal plan  Demonstrates carbohydrate counting   States when to check blood glucose levels involving a total of 4 separate occurences in a day  Demonstrates proper blood glucose monitoring techniques  States the effect of stress and exercise on blood glucose levels  States the importance of limiting caffeine and abstaining from alcohol and smoking  Demonstrates the knowledge the glucometer provided in class may not be covered by their insurance and to call their insurance provider immediately after class to know which glucometer their insurance provider does cover as well as calling their physician the next day for a prescription to the glucometer their insurance does cover (if the one provided is not) as well as the lancets and strips for that meter.  Blood glucose monitor given: Accu Check Guide Lot # [id number]  Exp: 12/20/17  Blood glucose reading: 81  Patient instructed to monitor glucose levels: FBS: 60 - <90 1 hour: <140 2 hour: <120  *Patient received handouts:  Nutrition Diabetes and Pregnancy  Carbohydrate Counting List  Patient will be seen for follow-up as needed.

## 2017-02-13 ENCOUNTER — Ambulatory Visit (INDEPENDENT_AMBULATORY_CARE_PROVIDER_SITE_OTHER): Payer: Medicaid Other | Admitting: Family Medicine

## 2017-02-13 VITALS — BP 135/76 | HR 94

## 2017-02-13 DIAGNOSIS — Z3482 Encounter for supervision of other normal pregnancy, second trimester: Secondary | ICD-10-CM

## 2017-02-13 DIAGNOSIS — O24419 Gestational diabetes mellitus in pregnancy, unspecified control: Secondary | ICD-10-CM

## 2017-02-13 NOTE — Progress Notes (Signed)
Subjective:  Alicia Pollard is a 26 y.o. [phone] at [redacted]w[redacted]d being seen today for ongoing prenatal care.  She is currently monitored for the following issues for this high-risk pregnancy and has Supervision of normal pregnancy; Insufficient prenatal care, second trimester; Obesity (BMI 30-39.9); and GDM (gestational diabetes mellitus) on her problem list.  GDM: Patient diet controlled.  Just started testing - has 3 days of numbers. Reports no hypoglycemic episodes.  Tolerating medication well Fasting: 87-111 2hr PP: has 3 elevated values, one >200, but mostly within range.  Patient reports no complaints.  Contractions: Not present. Vag. Bleeding: None.  Movement: Present. Denies leaking of fluid.   The following portions of the patient's history were reviewed and updated as appropriate: allergies, current medications, past family history, past medical history, past social history, past surgical history and problem list. Problem list updated.  Objective:   Vitals:   02/13/17 0955  BP: 135/76  Pulse: 94    Fetal Status: Fetal Heart Rate (bpm): 145   Movement: Present     General:  Alert, oriented and cooperative. Patient is in no acute distress.  Skin: Skin is warm and dry. No rash noted.   Cardiovascular: Normal heart rate noted  Respiratory: Normal respiratory effort, no problems with respiration noted  Abdomen: Soft, gravid, appropriate for gestational age. Pain/Pressure: Present     Pelvic: Vag. Bleeding: None Vag D/C Character: Thin   Cervical exam deferred        Extremities: Normal range of motion.  Edema: Trace  Mental Status: Normal mood and affect. Normal behavior. Normal judgment and thought content.   Urinalysis:      Assessment and Plan:  Pregnancy: [id number] at [redacted]w[redacted]d  1. Encounter for supervision of other normal pregnancy in second trimester FHT normal  2. Gestational diabetes mellitus (GDM) in third trimester, gestational diabetes method of control  unspecified Continue testing. Will not place on medication yet. She is going to email me her numbers next week via MyChart, and follow up in 2 weeks.  Preterm labor symptoms and general obstetric precautions including but not limited to vaginal bleeding, contractions, leaking of fluid and fetal movement were reviewed in detail with the patient. Please refer to After Visit Summary for other counseling recommendations.  Return in about 2 weeks (around 02/27/2017) for OB f/u.   Levie Heritage, DO

## 2017-02-27 ENCOUNTER — Ambulatory Visit (INDEPENDENT_AMBULATORY_CARE_PROVIDER_SITE_OTHER): Payer: Medicaid Other | Admitting: Family Medicine

## 2017-02-27 VITALS — BP 114/77 | HR 106 | Wt 320.0 lb

## 2017-02-27 DIAGNOSIS — Z3482 Encounter for supervision of other normal pregnancy, second trimester: Secondary | ICD-10-CM

## 2017-02-27 DIAGNOSIS — O24419 Gestational diabetes mellitus in pregnancy, unspecified control: Secondary | ICD-10-CM

## 2017-02-27 MED ORDER — GLYBURIDE 2.5 MG PO TABS: 2.5000 mg | ORAL_TABLET | Freq: Every day | ORAL | 3 refills | Status: DC

## 2017-02-27 NOTE — Progress Notes (Signed)
Subjective:  Alicia Pollard is a 26 y.o. [phone] at [redacted]w[redacted]d being seen today for ongoing prenatal care.  She is currently monitored for the following issues for this high-risk pregnancy and has Supervision of normal pregnancy; Insufficient prenatal care, second trimester; Obesity (BMI 30-39.9); and GDM (gestational diabetes mellitus) on her problem list.  GDM: Patient diet controlled. Patient not compliant with checking blood sugars  Fasting: 100-110 2hr PP: appears fairly well controlled  Patient reports no complaints.  Contractions: Not present. Vag. Bleeding: None.  Movement: Present. Denies leaking of fluid.   The following portions of the patient's history were reviewed and updated as appropriate: allergies, current medications, past family history, past medical history, past social history, past surgical history and problem list. Problem list updated.  Objective:   Vitals:   02/27/17 1051  BP: 114/77  Pulse: (!) 106  Weight: (!) 320 lb (145.2 kg)    Fetal Status: Fetal Heart Rate (bpm): 140 Fundal Height: 34 cm Movement: Present     General:  Alert, oriented and cooperative. Patient is in no acute distress.  Skin: Skin is warm and dry. No rash noted.   Cardiovascular: Normal heart rate noted  Respiratory: Normal respiratory effort, no problems with respiration noted  Abdomen: Soft, gravid, appropriate for gestational age. Pain/Pressure: Present     Pelvic: Vag. Bleeding: None Vag D/C Character: Thin   Cervical exam deferred        Extremities: Normal range of motion.  Edema: Trace  Mental Status: Normal mood and affect. Normal behavior. Normal judgment and thought content.   Urinalysis:      Assessment and Plan:  Pregnancy: [id number] at [redacted]w[redacted]d  1. Encounter for supervision of other normal pregnancy in second trimester FHT normal. F/u US. - [place] MFM OB FOLLOW UP; Future - [place] MFM FETAL BPP WO NON STRESS; Future  2. Gestational diabetes mellitus (GDM) in third trimester,  gestational diabetes method of control unspecified Start glyburide 2.5mg  at night. Having difficulty checking blood sugars 2hrs PP. Will switch to 1hr PP testing. Will start BPP weekly. - [place] MFM OB FOLLOW UP; Future - [place] MFM FETAL BPP WO NON STRESS; Future  Preterm labor symptoms and general obstetric precautions including but not limited to vaginal bleeding, contractions, leaking of fluid and fetal movement were reviewed in detail with the patient. Please refer to After Visit Summary for other counseling recommendations.  No Follow-up on file.   Levie Heritage, DO

## 2017-03-06 ENCOUNTER — Encounter (HOSPITAL_COMMUNITY): Payer: Self-pay | Admitting: *Deleted

## 2017-03-06 ENCOUNTER — Inpatient Hospital Stay (HOSPITAL_COMMUNITY)
Admission: AD | Admit: 2017-03-06 | Discharge: 2017-03-06 | Disposition: A | Discharge status: Home / Self Care | Payer: Medicaid Other | Source: Ambulatory Visit | Attending: Obstetrics and Gynecology | Admitting: Obstetrics and Gynecology

## 2017-03-06 ENCOUNTER — Ambulatory Visit (HOSPITAL_COMMUNITY)
Admission: RE | Admit: 2017-03-06 | Discharge: 2017-03-06 | Disposition: A | Discharge status: Home / Self Care | Payer: Medicaid Other | Source: Ambulatory Visit | Attending: Family Medicine | Admitting: Family Medicine

## 2017-03-06 DIAGNOSIS — Z3A32 32 weeks gestation of pregnancy: Secondary | ICD-10-CM | POA: Insufficient documentation

## 2017-03-06 DIAGNOSIS — O24419 Gestational diabetes mellitus in pregnancy, unspecified control: Secondary | ICD-10-CM

## 2017-03-06 DIAGNOSIS — Z3482 Encounter for supervision of other normal pregnancy, second trimester: Secondary | ICD-10-CM

## 2017-03-06 DIAGNOSIS — Z87891 Personal history of nicotine dependence: Secondary | ICD-10-CM | POA: Insufficient documentation

## 2017-03-06 DIAGNOSIS — O99213 Obesity complicating pregnancy, third trimester: Secondary | ICD-10-CM | POA: Diagnosis not present

## 2017-03-06 DIAGNOSIS — E119 Type 2 diabetes mellitus without complications: Secondary | ICD-10-CM | POA: Insufficient documentation

## 2017-03-06 DIAGNOSIS — O9989 Other specified diseases and conditions complicating pregnancy, childbirth and the puerperium: Secondary | ICD-10-CM

## 2017-03-06 DIAGNOSIS — E669 Obesity, unspecified: Secondary | ICD-10-CM | POA: Diagnosis not present

## 2017-03-06 DIAGNOSIS — O24410 Gestational diabetes mellitus in pregnancy, diet controlled: Secondary | ICD-10-CM | POA: Insufficient documentation

## 2017-03-06 DIAGNOSIS — Z3689 Encounter for other specified antenatal screening: Secondary | ICD-10-CM | POA: Diagnosis not present

## 2017-03-06 DIAGNOSIS — O24113 Pre-existing diabetes mellitus, type 2, in pregnancy, third trimester: Secondary | ICD-10-CM | POA: Diagnosis not present

## 2017-03-06 NOTE — MAU Provider Note (Signed)
History     CSN: [id number]  Arrival date and time: 03/06/17 1607   First Provider Initiated Contact with Patient 03/06/17 1653      Chief Complaint  Patient presents with  . fetal monitoring   HPI Alicia Pollard is a 26 y.o. [id number] at 79w4dwho presents for MFM for fetal monitoring. Patient had BPP today 6/8, off for breathing. Sent for fetal monitoring. Patient denies contractions, LOF, or vaginal bleeding. Positive fetal movement.   OB History    Gravida Para Term Preterm AB Living   _0 SAB TAB Ectopic Multiple Live Births     2     2      Past Medical History:  Diagnosis Date  . Diabetes mellitus without complication ([facility]     Past Surgical History:  Procedure Laterality Date  . NO PAST SURGERIES      Family History  Problem Relation Age of Onset  . Asthma Sister   . Diabetes Maternal Aunt   . Hypertension Maternal Aunt   . Hypertension Maternal Grandmother   . Cancer Neg Hx   . Stroke Neg Hx     Social History  Substance Use Topics  . Smoking status: Former SResearch scientist (life sciences) . Smokeless tobacco: Never Used  . Alcohol use No    Allergies: No Known Allergies  Prescriptions Prior to Admission  Medication Sig Dispense Refill Last Dose  . ACCU-CHEK FASTCLIX LANCETS MISC 1 Units by Percutaneous route 4 (four) times daily. 100 each 12 Taking  . Blood Glucose Monitoring Suppl (ACCU-CHEK NANO SMARTVIEW) w/Device KIT 1 kit by Subdermal route as directed. Check blood sugars for fasting, and two hours after breakfast, lunch and dinner (4 checks daily) 1 kit 0 Taking  . ferrous sulfate (FERROUSUL) 325 (65 FE) MG tablet Take 1 tablet (325 mg total) by mouth 2 (two) times daily. 60 tablet 1 Taking  . glucose blood (ACCU-CHEK SMARTVIEW) test strip Use as instructed to check blood sugars 100 each 12 Taking  . glyBURIDE (DIABETA) 2.5 MG tablet Take 1 tablet (2.5 mg total) by mouth at bedtime. 30 tablet 3   . prenatal vitamin w/FE, FA (PRENATAL 1 + 1) 27-1 MG TABS  tablet Take 1 tablet by mouth daily at 12 noon. 30 each 12 Taking    Review of Systems  Constitutional: Negative.   Gastrointestinal: Negative.   Genitourinary: Negative.    Physical Exam   Blood pressure 93/64, pulse (!) 103, temperature 98.5 F (36.9 C), temperature source Oral, resp. rate 20, last menstrual period 07/31/2016.  Physical Exam  Nursing note and vitals reviewed. Constitutional: She is oriented to person, place, and time. She appears well-developed and well-nourished. No distress.  HENT:  Head: Normocephalic and atraumatic.  Eyes: Conjunctivae are normal. Right eye exhibits no discharge. Left eye exhibits no discharge. No scleral icterus.  Neck: Normal range of motion.  Cardiovascular:  No murmur heard. Respiratory: Effort normal. No respiratory distress.  Neurological: She is alert and oriented to person, place, and time.  Skin: Skin is warm and dry. She is not diaphoretic.  Psychiatric: She has a normal mood and affect. Her behavior is normal. Judgment and thought content normal.   Fetal Tracing:  Baseline: 145 Variability: moderate Accelerations: 15x15 Decelerations: none  Toco: none MAU Course  Procedures No results found for this or any previous visit (from the past 24 hour(s)).  MDM Reactive NST S/w Dr. FGlo Herring Ok to discharge home  Assessment and  Plan  A; 1. NST (non-stress test) reactive    P: Discharge home Discussed reasons to return to MAU Keep f/u with OB tomorrow  Jorje Guild 03/06/2017, 4:55 PM

## 2017-03-06 NOTE — MAU Note (Signed)
Pt sent to MAU from [facility], had BPP 6/8, pt denies decreased FM, contractions, bleeding or LOF.

## 2017-03-06 NOTE — Discharge Instructions (Signed)
Fetal Movement Counts  Patient Name: ________________________________________________ Patient Due Date: ____________________  What is a fetal movement count?  A fetal movement count is the number of times that you feel your baby move during a certain amount of time. This may also be called a fetal kick count. A fetal movement count is recommended for every pregnant woman. You may be asked to start counting fetal movements as early as week 28 of your pregnancy.  Pay attention to when your baby is most active. You may notice your baby's sleep and wake cycles. You may also notice things that make your baby move more. You should do a fetal movement count:  · When your baby is normally most active.  · At the same time each day.    A good time to count movements is while you are resting, after having something to eat and drink.  How do I count fetal movements?  1. Find a quiet, comfortable area. Sit, or lie down on your side.  2. Write down the date, the start time and stop time, and the number of movements that you felt between those two times. Take this information with you to your health care visits.  3. For 2 hours, count kicks, flutters, swishes, rolls, and jabs. You should feel at least 10 movements during 2 hours.  4. You may stop counting after you have felt 10 movements.  5. If you do not feel 10 movements in 2 hours, have something to eat and drink. Then, keep resting and counting for 1 hour. If you feel at least 4 movements during that hour, you may stop counting.  Contact a health care provider if:  · You feel fewer than 4 movements in 2 hours.  · Your baby is not moving like he or she usually does.  Date: ____________ Start time: ____________ Stop time: ____________ Movements: ____________  Date: ____________ Start time: ____________ Stop time: ____________ Movements: ____________  Date: ____________ Start time: ____________ Stop time: ____________ Movements: ____________  Date: ____________ Start time:  ____________ Stop time: ____________ Movements: ____________  Date: ____________ Start time: ____________ Stop time: ____________ Movements: ____________  Date: ____________ Start time: ____________ Stop time: ____________ Movements: ____________  Date: ____________ Start time: ____________ Stop time: ____________ Movements: ____________  Date: ____________ Start time: ____________ Stop time: ____________ Movements: ____________  Date: ____________ Start time: ____________ Stop time: ____________ Movements: ____________  This information is not intended to replace advice given to you by your health care provider. Make sure you discuss any questions you have with your health care provider.  Document Released: 11/20/2006 Document Revised: 06/19/2016 Document Reviewed: 11/30/2015  Elsevier Interactive Patient Education © 2017 Elsevier Inc.

## 2017-03-07 ENCOUNTER — Ambulatory Visit (INDEPENDENT_AMBULATORY_CARE_PROVIDER_SITE_OTHER): Payer: Medicaid Other | Admitting: Obstetrics & Gynecology

## 2017-03-07 VITALS — BP 123/53 | HR 93 | Wt 319.0 lb

## 2017-03-07 DIAGNOSIS — O99212 Obesity complicating pregnancy, second trimester: Secondary | ICD-10-CM

## 2017-03-07 DIAGNOSIS — Z3482 Encounter for supervision of other normal pregnancy, second trimester: Secondary | ICD-10-CM

## 2017-03-07 DIAGNOSIS — E669 Obesity, unspecified: Secondary | ICD-10-CM

## 2017-03-07 DIAGNOSIS — O24419 Gestational diabetes mellitus in pregnancy, unspecified control: Secondary | ICD-10-CM | POA: Diagnosis not present

## 2017-03-07 NOTE — Progress Notes (Signed)
   PRENATAL VISIT NOTE  Subjective:  Alicia Pollard is a 26 y.o. [phone] at 4217w5d being seen today for ongoing prenatal care.  She is currently monitored for the following issues for this high-risk pregnancy and has Supervision of normal pregnancy; Insufficient prenatal care, second trimester; Obesity (BMI 30-39.9); and GDM (gestational diabetes mellitus) on her problem list.  Patient reports no complaints.  Contractions: Not present. Vag. Bleeding: None.  Movement: Present. Denies leaking of fluid.   The following portions of the patient's history were reviewed and updated as appropriate: allergies, current medications, past family history, past medical history, past social history, past surgical history and problem list. Problem list updated.  Objective:   Vitals:   03/07/17 1057  BP: (!) 123/53  Pulse: 93  Weight: (!) 319 lb (144.7 kg)    Fetal Status: Fetal Heart Rate (bpm): 140   Movement: Present     General:  Alert, oriented and cooperative. Patient is in no acute distress.  Skin: Skin is warm and dry. No rash noted.   Cardiovascular: Normal heart rate noted  Respiratory: Normal respiratory effort, no problems with respiration noted  Abdomen: Soft, gravid, appropriate for gestational age. Pain/Pressure: Present     Pelvic:  Cervical exam deferred        Extremities: Normal range of motion.  Edema: Trace  Mental Status: Normal mood and affect. Normal behavior. Normal judgment and thought content.   Assessment and Plan:  Pregnancy: [id number] at 4917w5d  1. Gestational diabetes mellitus (GDM) in third trimester, gestational diabetes method of control unspecified - Her sugars are ok for the most part (except when she drank juice). Due to time constraints, she is checking a 1 hour GT versus 2 hour but these are ok  - She starts twice weekly testing now. She had a 8/10 BPP yesterday at MFM  2. Obesity (BMI 30-39.9)   3. Encounter for supervision of other normal pregnancy in  second trimester   Preterm labor symptoms and general obstetric precautions including but not limited to vaginal bleeding, contractions, leaking of fluid and fetal movement were reviewed in detail with the patient. Please refer to After Visit Summary for other counseling recommendations.  No Follow-up on file.   Allie BossierMyra C Esaul Dorwart, MD

## 2017-03-10 ENCOUNTER — Other Ambulatory Visit (HOSPITAL_COMMUNITY)
Admission: RE | Admit: 2017-03-10 | Discharge: 2017-03-10 | Disposition: A | Discharge status: Home / Self Care | Payer: Medicaid Other | Source: Ambulatory Visit | Attending: Obstetrics & Gynecology | Admitting: Obstetrics & Gynecology

## 2017-03-10 ENCOUNTER — Ambulatory Visit (INDEPENDENT_AMBULATORY_CARE_PROVIDER_SITE_OTHER): Payer: Medicaid Other

## 2017-03-10 VITALS — BP 114/62 | HR 105

## 2017-03-10 DIAGNOSIS — L298 Other pruritus: Secondary | ICD-10-CM | POA: Insufficient documentation

## 2017-03-10 DIAGNOSIS — N898 Other specified noninflammatory disorders of vagina: Secondary | ICD-10-CM

## 2017-03-10 DIAGNOSIS — B373 Candidiasis of vulva and vagina: Secondary | ICD-10-CM | POA: Diagnosis not present

## 2017-03-10 DIAGNOSIS — O24419 Gestational diabetes mellitus in pregnancy, unspecified control: Secondary | ICD-10-CM | POA: Diagnosis not present

## 2017-03-10 MED ORDER — GLUCOSE BLOOD VI STRP: ORAL_STRIP | 12 refills | Status: DC

## 2017-03-10 NOTE — Addendum Note (Signed)
Addended by: Anell BarrHOWARD, Tonetta Napoles L on: 03/10/2017 04:13 PM   Modules accepted: Orders

## 2017-03-10 NOTE — Progress Notes (Signed)
NST visit. Patient complaining that she used a different soap and having vaginal itching. Patient to do self swab for yeast and bacterial vaginosis. Armandina StammerJennifer Tracee Mccreery RNBSN

## 2017-03-11 LAB — CERVICOVAGINAL ANCILLARY ONLY
Bacterial vaginitis: NEGATIVE
CANDIDA VAGINITIS: POSITIVE — AB

## 2017-03-12 ENCOUNTER — Other Ambulatory Visit: Payer: Self-pay | Admitting: Obstetrics & Gynecology

## 2017-03-12 DIAGNOSIS — B3731 Acute candidiasis of vulva and vagina: Secondary | ICD-10-CM

## 2017-03-12 DIAGNOSIS — B373 Candidiasis of vulva and vagina: Secondary | ICD-10-CM

## 2017-03-12 MED ORDER — FLUCONAZOLE 150 MG PO TABS: 150.0000 mg | ORAL_TABLET | Freq: Once | ORAL | 3 refills | Status: AC

## 2017-03-13 ENCOUNTER — Ambulatory Visit (INDEPENDENT_AMBULATORY_CARE_PROVIDER_SITE_OTHER): Payer: Medicaid Other

## 2017-03-13 VITALS — BP 112/68 | HR 102

## 2017-03-13 DIAGNOSIS — O24419 Gestational diabetes mellitus in pregnancy, unspecified control: Secondary | ICD-10-CM

## 2017-03-13 NOTE — Progress Notes (Signed)
Patient present for NST only. FMLA paperwork completed.Alicia Pollard. Jennifer Howard RN

## 2017-03-14 ENCOUNTER — Telehealth: Payer: Self-pay

## 2017-03-14 NOTE — Telephone Encounter (Signed)
Patient made aware that she has a positive culture for yeast infection and Dr. Erin FullingHarraway Smith has sent in a diflucan to her pharmacy. Patient states understanding and states she has actually been using Monistat cream because the itching was so bad. Armandina StammerJennifer Howard RNBSN

## 2017-03-17 ENCOUNTER — Ambulatory Visit (INDEPENDENT_AMBULATORY_CARE_PROVIDER_SITE_OTHER): Payer: Medicaid Other

## 2017-03-17 VITALS — BP 123/71 | HR 91

## 2017-03-17 DIAGNOSIS — O24419 Gestational diabetes mellitus in pregnancy, unspecified control: Secondary | ICD-10-CM

## 2017-03-17 NOTE — Progress Notes (Signed)
Patient presents for NST only visit. Armandina StammerJennifer Trinty Marken RNBSN

## 2017-03-20 ENCOUNTER — Ambulatory Visit (INDEPENDENT_AMBULATORY_CARE_PROVIDER_SITE_OTHER): Payer: Medicaid Other | Admitting: Family Medicine

## 2017-03-20 ENCOUNTER — Encounter: Payer: Medicaid Other | Admitting: Family Medicine

## 2017-03-20 VITALS — BP 121/66 | HR 101 | Wt 325.0 lb

## 2017-03-20 DIAGNOSIS — O99213 Obesity complicating pregnancy, third trimester: Secondary | ICD-10-CM | POA: Diagnosis not present

## 2017-03-20 DIAGNOSIS — Z3482 Encounter for supervision of other normal pregnancy, second trimester: Secondary | ICD-10-CM

## 2017-03-20 DIAGNOSIS — O24419 Gestational diabetes mellitus in pregnancy, unspecified control: Secondary | ICD-10-CM | POA: Diagnosis not present

## 2017-03-20 DIAGNOSIS — E669 Obesity, unspecified: Secondary | ICD-10-CM

## 2017-03-20 NOTE — Progress Notes (Signed)
Subjective:  Alicia Pollard is a 26 y.o. [phone] at 3370w4d being seen today for ongoing prenatal care.  She is currently monitored for the following issues for this high-risk pregnancy and has Supervision of normal pregnancy; Insufficient prenatal care, second trimester; Obesity (BMI 30-39.9); and GDM (gestational diabetes mellitus) on her problem list.  GDM: Patient taking glyburide 2.5mg  at bedtime.  Reports no hypoglycemic episodes.  Tolerating medication well. Patient did not bring log book, but reports: Fasting: < 90 2hr PP: occasional high after drinking juice, but mostly controlled. Checking CBGs 1hr PP.  Patient reports no complaints.  Contractions: Not present. Vag. Bleeding: None.  Movement: Present. Denies leaking of fluid.   The following portions of the patient's history were reviewed and updated as appropriate: allergies, current medications, past family history, past medical history, past social history, past surgical history and problem list. Problem list updated.  Objective:   Vitals:   03/20/17 0855  BP: 121/66  Pulse: (!) 101  Weight: (!) 325 lb (147.4 kg)    Fetal Status: Fetal Heart Rate (bpm): NST   Movement: Present     General:  Alert, oriented and cooperative. Patient is in no acute distress.  Skin: Skin is warm and dry. No rash noted.   Cardiovascular: Normal heart rate noted  Respiratory: Normal respiratory effort, no problems with respiration noted  Abdomen: Soft, gravid, appropriate for gestational age. Pain/Pressure: Present     Pelvic: Vag. Bleeding: None     Cervical exam deferred        Extremities: Normal range of motion.  Edema: Trace  Mental Status: Normal mood and affect. Normal behavior. Normal judgment and thought content.   Urinalysis:      Assessment and Plan:  Pregnancy: [id number] at 5470w4d  1. Encounter for supervision of other normal pregnancy in second trimester FHT normal  2. Gestational diabetes mellitus (GDM) in third trimester,  gestational diabetes method of control unspecified Continue glucose testing - 1hr PP. Reinforced diabetic diet - no juice. NST - reactive Continue twice weekly testing.  3. Obesity (BMI 30-39.9)   Preterm labor symptoms and general obstetric precautions including but not limited to vaginal bleeding, contractions, leaking of fluid and fetal movement were reviewed in detail with the patient. Please refer to After Visit Summary for other counseling recommendations.  No Follow-up on file.   Levie HeritageStinson, Kylen Schliep J, DO

## 2017-03-20 NOTE — Addendum Note (Signed)
Addended by: Anell BarrHOWARD, Cincere Zorn L on: 03/20/2017 11:12 AM   Modules accepted: Orders

## 2017-03-24 ENCOUNTER — Ambulatory Visit (INDEPENDENT_AMBULATORY_CARE_PROVIDER_SITE_OTHER): Payer: Medicaid Other

## 2017-03-24 DIAGNOSIS — O24419 Gestational diabetes mellitus in pregnancy, unspecified control: Secondary | ICD-10-CM | POA: Diagnosis not present

## 2017-03-24 NOTE — Progress Notes (Signed)
NST only visit. Devany Aja RNBSN 

## 2017-03-27 ENCOUNTER — Other Ambulatory Visit: Payer: Self-pay | Admitting: Family Medicine

## 2017-03-27 ENCOUNTER — Encounter (HOSPITAL_COMMUNITY): Payer: Self-pay

## 2017-03-27 ENCOUNTER — Ambulatory Visit (HOSPITAL_COMMUNITY)
Admission: RE | Admit: 2017-03-27 | Discharge: 2017-03-27 | Disposition: A | Discharge status: Home / Self Care | Payer: Medicaid Other | Source: Ambulatory Visit | Attending: Family Medicine | Admitting: Family Medicine

## 2017-03-27 ENCOUNTER — Encounter: Payer: Medicaid Other | Admitting: Family Medicine

## 2017-03-27 DIAGNOSIS — O24415 Gestational diabetes mellitus in pregnancy, controlled by oral hypoglycemic drugs: Secondary | ICD-10-CM

## 2017-03-27 DIAGNOSIS — Z3482 Encounter for supervision of other normal pregnancy, second trimester: Secondary | ICD-10-CM

## 2017-03-27 DIAGNOSIS — Z3A35 35 weeks gestation of pregnancy: Secondary | ICD-10-CM

## 2017-03-27 DIAGNOSIS — O99213 Obesity complicating pregnancy, third trimester: Secondary | ICD-10-CM

## 2017-04-02 ENCOUNTER — Ambulatory Visit (INDEPENDENT_AMBULATORY_CARE_PROVIDER_SITE_OTHER): Payer: Medicaid Other | Admitting: Obstetrics & Gynecology

## 2017-04-02 ENCOUNTER — Other Ambulatory Visit (HOSPITAL_COMMUNITY)
Admission: RE | Admit: 2017-04-02 | Discharge: 2017-04-02 | Disposition: A | Discharge status: Home / Self Care | Payer: Medicaid Other | Source: Ambulatory Visit | Attending: Obstetrics & Gynecology | Admitting: Obstetrics & Gynecology

## 2017-04-02 VITALS — BP 121/67 | HR 93 | Wt 321.0 lb

## 2017-04-02 DIAGNOSIS — O99213 Obesity complicating pregnancy, third trimester: Secondary | ICD-10-CM | POA: Diagnosis not present

## 2017-04-02 DIAGNOSIS — Z3493 Encounter for supervision of normal pregnancy, unspecified, third trimester: Secondary | ICD-10-CM | POA: Insufficient documentation

## 2017-04-02 DIAGNOSIS — Z3403 Encounter for supervision of normal first pregnancy, third trimester: Secondary | ICD-10-CM

## 2017-04-02 DIAGNOSIS — O0933 Supervision of pregnancy with insufficient antenatal care, third trimester: Secondary | ICD-10-CM

## 2017-04-02 DIAGNOSIS — O0932 Supervision of pregnancy with insufficient antenatal care, second trimester: Secondary | ICD-10-CM

## 2017-04-02 DIAGNOSIS — E669 Obesity, unspecified: Secondary | ICD-10-CM | POA: Diagnosis not present

## 2017-04-02 DIAGNOSIS — O24414 Gestational diabetes mellitus in pregnancy, insulin controlled: Secondary | ICD-10-CM

## 2017-04-02 DIAGNOSIS — Z3A36 36 weeks gestation of pregnancy: Secondary | ICD-10-CM | POA: Diagnosis not present

## 2017-04-02 DIAGNOSIS — Z6839 Body mass index (BMI) 39.0-39.9, adult: Secondary | ICD-10-CM | POA: Diagnosis not present

## 2017-04-02 LAB — OB RESULTS CONSOLE GC/CHLAMYDIA: Gonorrhea: NEGATIVE

## 2017-04-02 LAB — OB RESULTS CONSOLE GBS: GBS: NEGATIVE

## 2017-04-02 NOTE — Progress Notes (Signed)
   PRENATAL VISIT NOTE  Subjective:  Alicia Pollard is a 26 y.o. [phone] at 2431w3d being seen today for ongoing prenatal care.  She is currently monitored for the following issues for this high-risk pregnancy and has Supervision of normal pregnancy; Insufficient prenatal care, second trimester; Obesity (BMI 30-39.9); and GDM (gestational diabetes mellitus) on her problem list.  Patient reports no complaints.  Contractions: Not present. Vag. Bleeding: None.  Movement: Present. Denies leaking of fluid.   The following portions of the patient's history were reviewed and updated as appropriate: allergies, current medications, past family history, past medical history, past social history, past surgical history and problem list. Problem list updated.  Objective:   Vitals:   04/02/17 1426  BP: 121/67  Pulse: 93  Weight: (!) 321 lb (145.6 kg)    Fetal Status: Fetal Heart Rate (bpm): NST   Movement: Present     General:  Alert, oriented and cooperative. Patient is in no acute distress.  Skin: Skin is warm and dry. No rash noted.   Cardiovascular: Normal heart rate noted  Respiratory: Normal respiratory effort, no problems with respiration noted  Abdomen: Soft, gravid, appropriate for gestational age. Pain/Pressure: Present     Pelvic:  Cervical exam performed        Extremities: Normal range of motion.  Edema: Trace  Mental Status: Normal mood and affect. Normal behavior. Normal judgment and thought content.   Assessment and Plan:  Pregnancy: [id number] at 1731w3d  1. Prenatal care in third trimester  - Culture, beta strep (group b only) - GC/Chlamydia probe amp ([place])[facility]  2. Encounter for supervision of normal first pregnancy in third trimester  3. Obesity (BMI 30-39.9) Need US for growth  4. Insufficient prenatal care, second trimester  5. Insulin controlled gestational diabetes mellitus (GDM) in third trimester Reviewed glucose log on phone. Pt with fasting almost  all >90-111. 2 hours almost all >130's and 140's  Pt reports that she was initially following the diet but, she stopped.   Increase Glyburide to 5mg  qHS and add 2.5mg  qam NST reviewed and reactive.  Preterm labor symptoms and general obstetric precautions including but not limited to vaginal bleeding, contractions, leaking of fluid and fetal movement were reviewed in detail with the patient. Please refer to After Visit Summary for other counseling recommendations.  Return in about 1 week (around 04/09/2017).   Willodean Rosenthalarolyn Harraway-Smith, MD

## 2017-04-02 NOTE — Patient Instructions (Addendum)
Group B Streptococcus Infection During Pregnancy Group B Streptococcus (GBS) is a type of bacteria (Streptococcus agalactiae) that is often found in healthy people, commonly in the rectum, vagina, and intestines. In people who are healthy and not pregnant, the bacteria rarely cause serious illness or complications. However, women who test positive for GBS during pregnancy can pass the bacteria to their baby during childbirth, which can cause serious infection in the baby after birth. Women with GBS may also have infections during their pregnancy or immediately after childbirth, such as such as urinary tract infections (UTIs) or infections of the uterus (uterine infections). Having GBS also increases a woman's risk of complications during pregnancy, such as early (preterm) labor or delivery, miscarriage, or stillbirth. Routine testing (screening) for GBS is recommended for all pregnant women. What increases the risk? You may have a higher risk for GBS infection during pregnancy if you had one during a past pregnancy. What are the signs or symptoms? In most cases, GBS infection does not cause symptoms in pregnant women. Signs and symptoms of a possible GBS-related infection may include:  Labor starting before the 37th week of pregnancy.  A UTI or bladder infection, which may cause:  Fever.  Pain or burning during urination.  Frequent urination.  Fever during labor, along with:  Bad-smelling discharge.  Uterine tenderness.  Rapid heartbeat in the mother, baby, or both. Rare but serious symptoms of a possible GBS-related infection in women include:  Blood infection (septicemia). This may cause fever, chills, or confusion.  Lung infection (pneumonia). This may cause fever, chills, cough, rapid breathing, difficulty breathing, or chest pain.  Bone, joint, skin, or soft tissue infection. How is this diagnosed? You may be screened for GBS between week 35 and week 37 of your pregnancy. If you  have symptoms of preterm labor, you may be screened earlier. This condition is diagnosed based on lab test results from:  A swab of fluid from the vagina and rectum.  A urine sample. How is this treated? This condition is treated with antibiotic medicine. When you go into labor, or as soon as your water breaks (your membranes rupture), you will be given antibiotics through an IV tube. Antibiotics will continue until after you give birth. If you are having a cesarean delivery, you do not need antibiotics unless your membranes have already ruptured. Follow these instructions at home:  Take over-the-counter and prescription medicines only as told by your health care provider.  Take your antibiotic medicine as told by your health care provider. Do not stop taking the antibiotic even if you start to feel better.  Keep all pre-birth (prenatal) visits and follow-up visits as told by your health care provider. This is important. Contact a health care provider if:  You have pain or burning when you urinate.  You have to urinate frequently.  You have a fever or chills.  You develop a bad-smelling vaginal discharge. Get help right away if:  Your membranes rupture.  You go into labor.  You have severe pain in your abdomen.  You have difficulty breathing.  You have chest pain. This information is not intended to replace advice given to you by your health care provider. Make sure you discuss any questions you have with your health care provider. Document Released: 01/28/2008 Document Revised: 05/17/2016 Document Reviewed: 05/16/2016 Elsevier Interactive Patient Education  2017 Elsevier Inc. Diabetes Mellitus and Food It is important for you to manage your blood sugar (glucose) level. Your blood glucose level can be  greatly affected by what you eat. Eating healthier foods in the appropriate amounts throughout the day at about the same time each day will help you control your blood glucose  level. It can also help slow or prevent worsening of your diabetes mellitus. Healthy eating may even help you improve the level of your blood pressure and reach or maintain a healthy weight. General recommendations for healthful eating and cooking habits include:  Eating meals and snacks regularly. Avoid going long periods of time without eating to lose weight.  Eating a diet that consists mainly of plant-based foods, such as fruits, vegetables, nuts, legumes, and whole grains.  Using low-heat cooking methods, such as baking, instead of high-heat cooking methods, such as deep frying. Work with your dietitian to make sure you understand how to use the Nutrition Facts information on food labels. How can food affect me? Carbohydrates  Carbohydrates affect your blood glucose level more than any other type of food. Your dietitian will help you determine how many carbohydrates to eat at each meal and teach you how to count carbohydrates. Counting carbohydrates is important to keep your blood glucose at a healthy level, especially if you are using insulin or taking certain medicines for diabetes mellitus. Alcohol  Alcohol can cause sudden decreases in blood glucose (hypoglycemia), especially if you use insulin or take certain medicines for diabetes mellitus. Hypoglycemia can be a life-threatening condition. Symptoms of hypoglycemia (sleepiness, dizziness, and disorientation) are similar to symptoms of having too much alcohol. If your health care provider has given you approval to drink alcohol, do so in moderation and use the following guidelines:  Women should not have more than one drink per day, and men should not have more than two drinks per day. One drink is equal to:  12 oz of beer.  5 oz of wine.  1 oz of hard liquor.  Do not drink on an empty stomach.  Keep yourself hydrated. Have water, diet soda, or unsweetened iced tea.  Regular soda, juice, and other mixers might contain a lot of  carbohydrates and should be counted. What foods are not recommended? As you make food choices, it is important to remember that all foods are not the same. Some foods have fewer nutrients per serving than other foods, even though they might have the same number of calories or carbohydrates. It is difficult to get your body what it needs when you eat foods with fewer nutrients. Examples of foods that you should avoid that are high in calories and carbohydrates but low in nutrients include:  Trans fats (most processed foods list trans fats on the Nutrition Facts label).  Regular soda.  Juice.  Candy.  Sweets, such as cake, pie, doughnuts, and cookies.  Fried foods. What foods can I eat? Eat nutrient-rich foods, which will nourish your body and keep you healthy. The food you should eat also will depend on several factors, including:  The calories you need.  The medicines you take.  Your weight.  Your blood glucose level.  Your blood pressure level.  Your cholesterol level. You should eat a variety of foods, including:  Protein.  Lean cuts of meat.  Proteins low in saturated fats, such as fish, egg whites, and beans. Avoid processed meats.  Fruits and vegetables.  Fruits and vegetables that may help control blood glucose levels, such as apples, mangoes, and yams.  Dairy products.  Choose fat-free or low-fat dairy products, such as milk, yogurt, and cheese.  Grains, bread, pasta,  and rice.  Choose whole grain products, such as multigrain bread, whole oats, and brown rice. These foods may help control blood pressure.  Fats.  Foods containing healthful fats, such as nuts, avocado, olive oil, canola oil, and fish. Does everyone with diabetes mellitus have the same meal plan? Because every person with diabetes mellitus is different, there is not one meal plan that works for everyone. It is very important that you meet with a dietitian who will help you create a meal plan  that is just right for you. This information is not intended to replace advice given to you by your health care provider. Make sure you discuss any questions you have with your health care provider. Document Released: 07/18/2005 Document Revised: 03/28/2016 Document Reviewed: 09/17/2013 Elsevier Interactive Patient Education  2017 [employer].

## 2017-04-02 NOTE — Progress Notes (Signed)
Patient scheduled for induction 04-20-17 at 07:30AM. Armandina StammerJennifer Peyson Delao RNBSN

## 2017-04-04 LAB — GC/CHLAMYDIA PROBE AMP (~~LOC~~) NOT AT ARMC
CHLAMYDIA, DNA PROBE: NEGATIVE
Neisseria Gonorrhea: NEGATIVE

## 2017-04-07 ENCOUNTER — Ambulatory Visit (INDEPENDENT_AMBULATORY_CARE_PROVIDER_SITE_OTHER): Payer: Medicaid Other

## 2017-04-07 DIAGNOSIS — O24414 Gestational diabetes mellitus in pregnancy, insulin controlled: Secondary | ICD-10-CM | POA: Diagnosis not present

## 2017-04-07 LAB — CULTURE, BETA STREP (GROUP B ONLY): Strep Gp B Culture: NEGATIVE

## 2017-04-07 NOTE — Progress Notes (Signed)
NST only visit. Reactive NST. Armandina StammerJennifer Netty Sullivant RN BSN

## 2017-04-10 ENCOUNTER — Ambulatory Visit (INDEPENDENT_AMBULATORY_CARE_PROVIDER_SITE_OTHER): Payer: Medicaid Other | Admitting: Family Medicine

## 2017-04-10 VITALS — BP 120/69 | HR 103 | Wt 324.0 lb

## 2017-04-10 DIAGNOSIS — Z3403 Encounter for supervision of normal first pregnancy, third trimester: Secondary | ICD-10-CM

## 2017-04-10 DIAGNOSIS — O24415 Gestational diabetes mellitus in pregnancy, controlled by oral hypoglycemic drugs: Secondary | ICD-10-CM | POA: Diagnosis not present

## 2017-04-10 MED ORDER — GLYBURIDE 2.5 MG PO TABS: 2.5000 mg | ORAL_TABLET | Freq: Every day | ORAL | 3 refills | Status: DC

## 2017-04-10 NOTE — Progress Notes (Signed)
Subjective:  Alicia Pollard is a 26 y.o. [phone] at 5840w4d being seen today for ongoing prenatal care.  She is currently monitored for the following issues for this high-risk pregnancy and has Supervision of normal pregnancy; Insufficient prenatal care, second trimester; Obesity (BMI 30-39.9); and GDM (gestational diabetes mellitus) on her problem list.  GDM: Patient taking Glyburide.  Reports no hypoglycemic episodes.  Tolerating medication well Fasting: mostly 70-80, isolated elevated 2hr PP: not checking regularly, but mostly controlled with a few elevated.  Patient reports no complaints.  Contractions: Irritability. Vag. Bleeding: None.  Movement: Present. Denies leaking of fluid.   The following portions of the patient's history were reviewed and updated as appropriate: allergies, current medications, past family history, past medical history, past social history, past surgical history and problem list. Problem list updated.  Objective:   Vitals:   04/10/17 0833  BP: 120/69  Pulse: (!) 103  Weight: (!) 324 lb (147 kg)    Fetal Status: Fetal Heart Rate (bpm): NST   Movement: Present     General:  Alert, oriented and cooperative. Patient is in no acute distress.  Skin: Skin is warm and dry. No rash noted.   Cardiovascular: Normal heart rate noted  Respiratory: Normal respiratory effort, no problems with respiration noted  Abdomen: Soft, gravid, appropriate for gestational age. Pain/Pressure: Present     Pelvic: Vag. Bleeding: None Vag D/C Character: Thin   Cervical exam deferred        Extremities: Normal range of motion.  Edema: Trace  Mental Status: Normal mood and affect. Normal behavior. Normal judgment and thought content.   Urinalysis:      Assessment and Plan:  Pregnancy: [id number] at 2440w4d  1. Encounter for supervision of normal first pregnancy in third trimester FH and Fht normal  2. Gestational diabetes mellitus (GDM) in third trimester controlled on oral  hypoglycemic drug Continue glyburide. Improved control. NST reactive. Continue twice weekly. Induce at 39 weeks.   Term labor symptoms and general obstetric precautions including but not limited to vaginal bleeding, contractions, leaking of fluid and fetal movement were reviewed in detail with the patient. Please refer to After Visit Summary for other counseling recommendations.  No Follow-up on file.   Levie HeritageStinson, Arsema Tusing J, DO

## 2017-04-11 ENCOUNTER — Telehealth (HOSPITAL_COMMUNITY): Payer: Self-pay | Admitting: *Deleted

## 2017-04-14 ENCOUNTER — Ambulatory Visit (HOSPITAL_COMMUNITY)
Admission: RE | Admit: 2017-04-14 | Discharge: 2017-04-14 | Disposition: A | Discharge status: Home / Self Care | Payer: Medicaid Other | Source: Ambulatory Visit | Attending: Obstetrics & Gynecology | Admitting: Obstetrics & Gynecology

## 2017-04-14 ENCOUNTER — Ambulatory Visit (INDEPENDENT_AMBULATORY_CARE_PROVIDER_SITE_OTHER): Payer: Medicaid Other | Admitting: *Deleted

## 2017-04-14 DIAGNOSIS — O24415 Gestational diabetes mellitus in pregnancy, controlled by oral hypoglycemic drugs: Secondary | ICD-10-CM | POA: Insufficient documentation

## 2017-04-14 DIAGNOSIS — Z7984 Long term (current) use of oral hypoglycemic drugs: Secondary | ICD-10-CM | POA: Insufficient documentation

## 2017-04-14 DIAGNOSIS — Z3403 Encounter for supervision of normal first pregnancy, third trimester: Secondary | ICD-10-CM

## 2017-04-14 DIAGNOSIS — Z3689 Encounter for other specified antenatal screening: Secondary | ICD-10-CM | POA: Insufficient documentation

## 2017-04-14 DIAGNOSIS — Z3A38 38 weeks gestation of pregnancy: Secondary | ICD-10-CM | POA: Diagnosis not present

## 2017-04-14 DIAGNOSIS — O24419 Gestational diabetes mellitus in pregnancy, unspecified control: Secondary | ICD-10-CM | POA: Diagnosis not present

## 2017-04-14 DIAGNOSIS — O99213 Obesity complicating pregnancy, third trimester: Secondary | ICD-10-CM | POA: Diagnosis not present

## 2017-04-15 ENCOUNTER — Telehealth (HOSPITAL_COMMUNITY): Payer: Self-pay | Admitting: *Deleted

## 2017-04-15 ENCOUNTER — Encounter (HOSPITAL_COMMUNITY): Payer: Self-pay | Admitting: *Deleted

## 2017-04-15 NOTE — Telephone Encounter (Signed)
Preadmission screen  

## 2017-04-17 ENCOUNTER — Ambulatory Visit (INDEPENDENT_AMBULATORY_CARE_PROVIDER_SITE_OTHER): Payer: Medicaid Other | Admitting: Obstetrics & Gynecology

## 2017-04-17 VITALS — BP 128/73 | HR 90 | Wt 325.0 lb

## 2017-04-17 DIAGNOSIS — E669 Obesity, unspecified: Secondary | ICD-10-CM | POA: Diagnosis not present

## 2017-04-17 DIAGNOSIS — Z3483 Encounter for supervision of other normal pregnancy, third trimester: Secondary | ICD-10-CM | POA: Diagnosis not present

## 2017-04-17 DIAGNOSIS — O24414 Gestational diabetes mellitus in pregnancy, insulin controlled: Secondary | ICD-10-CM

## 2017-04-17 DIAGNOSIS — Z3480 Encounter for supervision of other normal pregnancy, unspecified trimester: Secondary | ICD-10-CM

## 2017-04-17 DIAGNOSIS — O0932 Supervision of pregnancy with insufficient antenatal care, second trimester: Secondary | ICD-10-CM

## 2017-04-17 NOTE — Patient Instructions (Signed)
Labor Induction Labor induction is when steps are taken to cause a pregnant woman to begin the labor process. Most women go into labor on their own between 37 weeks and 42 weeks of the pregnancy. When this does not happen or when there is a medical need, methods may be used to induce labor. Labor induction causes a pregnant woman's uterus to contract. It also causes the cervix to soften (ripen), open (dilate), and thin out (efface). Usually, labor is not induced before 39 weeks of the pregnancy unless there is a problem with the baby or mother. Before inducing labor, your health care provider will consider a number of factors, including the following:  The medical condition of you and the baby.  How many weeks along you are.  The status of the baby's lung maturity.  The condition of the cervix.  The position of the baby. What are the reasons for labor induction? Labor may be induced for the following reasons:  The health of the baby or mother is at risk.  The pregnancy is overdue by 1 week or more.  The water breaks but labor does not start on its own.  The mother has a health condition or serious illness, such as high blood pressure, infection, placental abruption, or diabetes.  The amniotic fluid amounts are low around the baby.  The baby is distressed. Convenience or wanting the baby to be born on a certain date is not a reason for inducing labor. What methods are used for labor induction? Several methods of labor induction may be used, such as:  Prostaglandin medicine. This medicine causes the cervix to dilate and ripen. The medicine will also start contractions. It can be taken by mouth or by inserting a suppository into the vagina.  Inserting a thin tube (catheter) with a balloon on the end into the vagina to dilate the cervix. Once inserted, the balloon is expanded with water, which causes the cervix to open.  Stripping the membranes. Your health care provider separates  amniotic sac tissue from the cervix, causing the cervix to be stretched and causing the release of a hormone called progesterone. This may cause the uterus to contract. It is often done during an office visit. You will be sent home to wait for the contractions to begin. You will then come in for an induction.  Breaking the water. Your health care provider makes a hole in the amniotic sac using a small instrument. Once the amniotic sac breaks, contractions should begin. This may still take hours to see an effect.  Medicine to trigger or strengthen contractions. This medicine is given through an IV access tube inserted into a vein in your arm. All of the methods of induction, besides stripping the membranes, will be done in the hospital. Induction is done in the hospital so that you and the baby can be carefully monitored. How long does it take for labor to be induced? Some inductions can take up to 2-3 days. Depending on the cervix, it usually takes less time. It takes longer when you are induced early in the pregnancy or if this is your first pregnancy. If a mother is still pregnant and the induction has been going on for 2-3 days, either the mother will be sent home or a cesarean delivery will be needed. What are the risks associated with labor induction? Some of the risks of induction include:  Changes in fetal heart rate, such as too high, too low, or erratic.  Fetal distress.    Chance of infection for the mother and baby.  Increased chance of having a cesarean delivery.  Breaking off (abruption) of the placenta from the uterus (rare).  Uterine rupture (very rare). When induction is needed for medical reasons, the benefits of induction may outweigh the risks. What are some reasons for not inducing labor? Labor induction should not be done if:  It is shown that your baby does not tolerate labor.  You have had previous surgeries on your uterus, such as a myomectomy or the removal of  fibroids.  Your placenta lies very low in the uterus and blocks the opening of the cervix (placenta previa).  Your baby is not in a head-down position.  The umbilical cord drops down into the birth canal in front of the baby. This could cut off the baby's blood and oxygen supply.  You have had a previous cesarean delivery.  There are unusual circumstances, such as the baby being extremely premature. This information is not intended to replace advice given to you by your health care provider. Make sure you discuss any questions you have with your health care provider. Document Released: 03/12/2007 Document Revised: 03/28/2016 Document Reviewed: 05/20/2013 Elsevier Interactive Patient Education  2017 [employer]  

## 2017-04-17 NOTE — Progress Notes (Signed)
   PRENATAL VISIT NOTE  Subjective:  Alicia Pollard is a 26 y.o. [phone] at 2121w4d being seen today for ongoing prenatal care.  She is currently monitored for the following issues for this high-risk pregnancy and has Supervision of normal pregnancy; Insufficient prenatal care, second trimester; Obesity (BMI 30-39.9); and GDM (gestational diabetes mellitus) on her problem list.  Patient reports contractions since many days and occasional contractions.  Contractions: Irritability. Vag. Bleeding: None.  Movement: Present. Denies leaking of fluid.   The following portions of the patient's history were reviewed and updated as appropriate: allergies, current medications, past family history, past medical history, past social history, past surgical history and problem list. Problem list updated.  Objective:   Vitals:   04/17/17 0851  BP: 128/73  Pulse: 90  Weight: (!) 325 lb (147.4 kg)    Fetal Status: Fetal Heart Rate (bpm): NST   Movement: Present     General:  Alert, oriented and cooperative. Patient is in no acute distress.  Skin: Skin is warm and dry. No rash noted.   Cardiovascular: Normal heart rate noted  Respiratory: Normal respiratory effort, no problems with respiration noted  Abdomen: Soft, gravid, appropriate for gestational age. Pain/Pressure: Present     Pelvic:  Cervical exam deferred        Extremities: Normal range of motion.  Edema: Trace  Mental Status: Normal mood and affect. Normal behavior. Normal judgment and thought content.   Assessment and Plan:  Pregnancy: [id number] at 6021w4d  1. Supervision of other normal pregnancy, antepartum  2. Obesity (BMI 30-39.9)  3. Insufficient prenatal care, second trimester  4. Insulin controlled gestational diabetes mellitus (GDM) in third trimester 03/06/2017  Est. FW:    2347  gm      5 lb 3 oz     80  %  NST reviewed and reactive.  Term labor symptoms and general obstetric precautions including but not limited to vaginal  bleeding, contractions, leaking of fluid and fetal movement were reviewed in detail with the patient. Please refer to After Visit Summary for other counseling recommendations.  Return in about 3 weeks (around 05/08/2017) for 2 1/2 weeks for PP state.   Willodean Rosenthalarolyn Harraway-Smith, MD

## 2017-04-18 ENCOUNTER — Other Ambulatory Visit: Payer: Self-pay | Admitting: Obstetrics & Gynecology

## 2017-04-20 ENCOUNTER — Encounter (HOSPITAL_COMMUNITY): Payer: Self-pay

## 2017-04-20 ENCOUNTER — Inpatient Hospital Stay (HOSPITAL_COMMUNITY): Payer: Medicaid Other | Admitting: Anesthesiology

## 2017-04-20 ENCOUNTER — Inpatient Hospital Stay (HOSPITAL_COMMUNITY)
Admission: RE | Admit: 2017-04-20 | Discharge: 2017-04-22 | DRG: 767 | Disposition: A | Discharge status: Home / Self Care | Payer: Medicaid Other | Source: Ambulatory Visit | Attending: Obstetrics & Gynecology | Admitting: Obstetrics & Gynecology

## 2017-04-20 DIAGNOSIS — O24425 Gestational diabetes mellitus in childbirth, controlled by oral hypoglycemic drugs: Principal | ICD-10-CM | POA: Diagnosis present

## 2017-04-20 DIAGNOSIS — Z23 Encounter for immunization: Secondary | ICD-10-CM

## 2017-04-20 DIAGNOSIS — Z302 Encounter for sterilization: Secondary | ICD-10-CM | POA: Diagnosis not present

## 2017-04-20 DIAGNOSIS — Z3480 Encounter for supervision of other normal pregnancy, unspecified trimester: Secondary | ICD-10-CM

## 2017-04-20 DIAGNOSIS — O99214 Obesity complicating childbirth: Secondary | ICD-10-CM | POA: Diagnosis present

## 2017-04-20 DIAGNOSIS — Z3A39 39 weeks gestation of pregnancy: Secondary | ICD-10-CM

## 2017-04-20 DIAGNOSIS — Z9851 Tubal ligation status: Secondary | ICD-10-CM

## 2017-04-20 DIAGNOSIS — Z87891 Personal history of nicotine dependence: Secondary | ICD-10-CM | POA: Diagnosis not present

## 2017-04-20 DIAGNOSIS — O24414 Gestational diabetes mellitus in pregnancy, insulin controlled: Secondary | ICD-10-CM | POA: Diagnosis not present

## 2017-04-20 DIAGNOSIS — Z6841 Body Mass Index (BMI) 40.0 and over, adult: Secondary | ICD-10-CM | POA: Diagnosis not present

## 2017-04-20 DIAGNOSIS — O24415 Gestational diabetes mellitus in pregnancy, controlled by oral hypoglycemic drugs: Secondary | ICD-10-CM | POA: Diagnosis present

## 2017-04-20 LAB — CBC
HCT: 30 % — ABNORMAL LOW (ref 36.0–46.0)
HEMOGLOBIN: 9.8 g/dL — AB (ref 12.0–15.0)
MCH: 24.6 pg — ABNORMAL LOW (ref 26.0–34.0)
MCHC: 32.7 g/dL (ref 30.0–36.0)
MCV: 75.4 fL — ABNORMAL LOW (ref 78.0–100.0)
PLATELETS: 383 10*3/uL (ref 150–400)
RBC: 3.98 MIL/uL (ref 3.87–5.11)
RDW: 15.8 % — ABNORMAL HIGH (ref 11.5–15.5)
WBC: 10.7 10*3/uL — AB (ref 4.0–10.5)

## 2017-04-20 LAB — GLUCOSE, CAPILLARY
GLUCOSE-CAPILLARY: 69 mg/dL (ref 65–99)
GLUCOSE-CAPILLARY: 80 mg/dL (ref 65–99)
Glucose-Capillary: 116 mg/dL — ABNORMAL HIGH (ref 65–99)
Glucose-Capillary: 95 mg/dL (ref 65–99)

## 2017-04-20 LAB — ABO/RH: ABO/RH(D): B POS

## 2017-04-20 LAB — TYPE AND SCREEN
ABO/RH(D): B POS
ANTIBODY SCREEN: NEGATIVE

## 2017-04-20 LAB — RPR: RPR: NONREACTIVE

## 2017-04-20 MED ORDER — LACTATED RINGERS IV SOLN: 500.0000 mL | Freq: Once | INTRAVENOUS | Status: DC

## 2017-04-20 MED ORDER — DIPHENHYDRAMINE HCL 50 MG/ML IJ SOLN: 12.5000 mg | INTRAMUSCULAR | Status: DC | PRN

## 2017-04-20 MED ORDER — EPHEDRINE 5 MG/ML INJ
10.0000 mg | INTRAVENOUS | Status: DC | PRN
  Filled 2017-04-20: qty 2

## 2017-04-20 MED ORDER — MISOPROSTOL 25 MCG QUARTER TABLET
25.0000 ug | ORAL_TABLET | ORAL | Status: DC
Start: 2017-04-20 — End: 2017-04-20
  Administered 2017-04-20: 25 ug via VAGINAL
  Filled 2017-04-20: qty 1

## 2017-04-20 MED ORDER — PHENYLEPHRINE 40 MCG/ML (10ML) SYRINGE FOR IV PUSH (FOR BLOOD PRESSURE SUPPORT)
80.0000 ug | PREFILLED_SYRINGE | INTRAVENOUS | Status: DC | PRN
  Filled 2017-04-20: qty 5

## 2017-04-20 MED ORDER — ACETAMINOPHEN 325 MG PO TABS: 650.0000 mg | ORAL_TABLET | ORAL | Status: DC | PRN

## 2017-04-20 MED ORDER — OXYTOCIN 40 UNITS IN LACTATED RINGERS INFUSION - SIMPLE MED: 2.5000 [IU]/h | INTRAVENOUS | Status: DC

## 2017-04-20 MED ORDER — FENTANYL 2.5 MCG/ML BUPIVACAINE 1/10 % EPIDURAL INFUSION (WH - ANES)
14.0000 mL/h | INTRAMUSCULAR | Status: DC | PRN
  Administered 2017-04-20: 14 mL/h via EPIDURAL

## 2017-04-20 MED ORDER — LACTATED RINGERS IV SOLN
500.0000 mL | Freq: Once | INTRAVENOUS | Status: AC
  Administered 2017-04-20: 500 mL via INTRAVENOUS

## 2017-04-20 MED ORDER — TERBUTALINE SULFATE 1 MG/ML IJ SOLN
0.2500 mg | Freq: Once | INTRAMUSCULAR | Status: DC | PRN
  Filled 2017-04-20: qty 1

## 2017-04-20 MED ORDER — FENTANYL 2.5 MCG/ML BUPIVACAINE 1/10 % EPIDURAL INFUSION (WH - ANES)
14.0000 mL/h | INTRAMUSCULAR | Status: DC | PRN
  Filled 2017-04-20: qty 100

## 2017-04-20 MED ORDER — OXYTOCIN BOLUS FROM INFUSION
500.0000 mL | Freq: Once | INTRAVENOUS | Status: AC
  Administered 2017-04-21: 500 mL via INTRAVENOUS

## 2017-04-20 MED ORDER — OXYCODONE-ACETAMINOPHEN 5-325 MG PO TABS: 2.0000 | ORAL_TABLET | ORAL | Status: DC | PRN

## 2017-04-20 MED ORDER — PHENYLEPHRINE 40 MCG/ML (10ML) SYRINGE FOR IV PUSH (FOR BLOOD PRESSURE SUPPORT)
80.0000 ug | PREFILLED_SYRINGE | INTRAVENOUS | Status: DC | PRN
  Filled 2017-04-20: qty 5
  Filled 2017-04-20: qty 10

## 2017-04-20 MED ORDER — OXYTOCIN 40 UNITS IN LACTATED RINGERS INFUSION - SIMPLE MED
1.0000 m[IU]/min | INTRAVENOUS | Status: DC
  Administered 2017-04-20: 2 m[IU]/min via INTRAVENOUS
  Filled 2017-04-20: qty 1000

## 2017-04-20 MED ORDER — EPHEDRINE 5 MG/ML INJ
10.0000 mg | INTRAVENOUS | Status: DC | PRN
Start: 2017-04-20 — End: 2017-04-21
  Filled 2017-04-20: qty 2

## 2017-04-20 MED ORDER — SOD CITRATE-CITRIC ACID 500-334 MG/5ML PO SOLN: 30.0000 mL | ORAL | Status: DC | PRN

## 2017-04-20 MED ORDER — LIDOCAINE HCL (PF) 1 % IJ SOLN
30.0000 mL | INTRAMUSCULAR | Status: DC | PRN
  Filled 2017-04-20: qty 30

## 2017-04-20 MED ORDER — ONDANSETRON HCL 4 MG/2ML IJ SOLN: 4.0000 mg | Freq: Four times a day (QID) | INTRAMUSCULAR | Status: DC | PRN

## 2017-04-20 MED ORDER — FLEET ENEMA 7-19 GM/118ML RE ENEM: 1.0000 | ENEMA | RECTAL | Status: DC | PRN

## 2017-04-20 MED ORDER — LACTATED RINGERS IV SOLN
500.0000 mL | INTRAVENOUS | Status: DC | PRN
  Administered 2017-04-20: 250 mL via INTRAVENOUS

## 2017-04-20 MED ORDER — OXYCODONE-ACETAMINOPHEN 5-325 MG PO TABS: 1.0000 | ORAL_TABLET | ORAL | Status: DC | PRN

## 2017-04-20 MED ORDER — LIDOCAINE HCL (PF) 1 % IJ SOLN
INTRAMUSCULAR | Status: DC | PRN
  Administered 2017-04-20 (×2): 5 mL via EPIDURAL

## 2017-04-20 MED ORDER — FENTANYL CITRATE (PF) 100 MCG/2ML IJ SOLN
100.0000 ug | INTRAMUSCULAR | Status: DC | PRN
  Administered 2017-04-20: 100 ug via INTRAVENOUS
  Filled 2017-04-20: qty 2

## 2017-04-20 MED ORDER — LACTATED RINGERS IV SOLN
INTRAVENOUS | Status: DC
  Administered 2017-04-20 (×4): via INTRAVENOUS

## 2017-04-20 NOTE — Progress Notes (Signed)
Alicia HeaterYasmine Pollard is a 26 y.o. [id number] at 6713w0d by ultrasound admitted for induction of labor due to Gestational diabetes.  Subjective:   Objective: Ht 5\' 9"  (1.753 m)   Wt (!) 325 lb (147.4 kg)   LMP 07/31/2016 (Approximate)   BMI 47.99 kg/m  No intake/output data recorded. No intake/output data recorded.  FHT:  FHR: 150 bpm, variability: moderate,  accelerations:  Present,  decelerations:  Absent UC:   irregular, every 4-5 minutes SVE:   Dilation: 2 Effacement (%): 50 Station: Ballotable Exam by:: stone rnc  Labs: Lab Results  Component Value Date   WBC 10.7 (H) 04/20/2017   HGB 9.8 (L) 04/20/2017   HCT 30.0 (L) 04/20/2017   MCV 75.4 (L) 04/20/2017   PLT 383 04/20/2017    Assessment / Plan: Induction of labor due to gestational diabetes,  progressing well on pitocin  Labor: yet to be in labor Preeclampsia:  no signs or symptoms of toxicity Fetal Wellbeing:  Category I Pain Control:  Labor support without medications I/D:  n/a Anticipated MOD:  NSVD  Alicia Pollard 04/20/2017, 11:41 AM

## 2017-04-20 NOTE — H&P (Signed)
Alicia HeaterYasmine Pollard is a 26 y.o. female presenting for IOL GDM A2. OB History    Gravida Para Term Preterm AB Living   5 2 2   2 2    SAB TAB Ectopic Multiple Live Births     2     2     Past Medical History:  Diagnosis Date  . Diabetes mellitus without complication (HCC)   . Gestational diabetes    glyburide   Past Surgical History:  Procedure Laterality Date  . NO PAST SURGERIES     Family History: family history includes Asthma in her sister; Diabetes in her maternal aunt; Hypertension in her maternal aunt and maternal grandmother. Social History:  reports that she has quit smoking. She has never used smokeless tobacco. She reports that she does not drink alcohol or use drugs.     Maternal Diabetes: Yes:  Diabetes Type:  Insulin/Medication controlled, Diet controlled Genetic Screening: Normal Maternal Ultrasounds/Referrals: Normal Fetal Ultrasounds or other Referrals:  None Maternal Substance Abuse:  No Significant Maternal Medications:  None Significant Maternal Lab Results:  None Other Comments:  None  Review of Systems  Constitutional: Negative.   HENT: Negative.   Eyes: Negative.   Cardiovascular: Negative.   Gastrointestinal: Negative.   Genitourinary: Negative.   Musculoskeletal: Negative.   Skin: Negative.   Neurological: Negative.   Endo/Heme/Allergies: Negative.   Psychiatric/Behavioral: Negative.    Maternal Medical History:  Reason for admission: IOL for A2GDM  Fetal activity: Perceived fetal activity is normal.   Last perceived fetal movement was within the past hour.    Prenatal complications: no prenatal complications Prenatal Complications - Diabetes: gestational. Diabetes is managed by oral agent (monotherapy) and diet.      Dilation: 2 Effacement (%): 50 Station: Ballotable Exam by:: stone rnc Height 5\' 9"  (1.753 m), weight (!) 325 lb (147.4 kg), last menstrual period 07/31/2016. Maternal Exam:  Uterine Assessment: none  Abdomen: Patient  reports no abdominal tenderness. Fetal presentation: vertex  Introitus: Normal vulva. Normal vagina.  Amniotic fluid character: not assessed.  Pelvis: adequate for delivery.   Cervix: Cervix evaluated by digital exam.     Fetal Exam Fetal Monitor Review: Mode: ultrasound.   Variability: moderate (6-25 bpm).   Pattern: accelerations present and no decelerations.    Fetal State Assessment: Category I - tracings are normal.     Physical Exam  Constitutional: She is oriented to person, place, and time. She appears well-developed and well-nourished.  HENT:  Head: Normocephalic.  Eyes: Pupils are equal, round, and reactive to light.  Neck: Normal range of motion.  Cardiovascular: Normal rate, regular rhythm, normal heart sounds and intact distal pulses.   Respiratory: Effort normal and breath sounds normal.  GI: Soft. Bowel sounds are normal.  Genitourinary: Vagina normal and uterus normal.  Musculoskeletal: Normal range of motion.  Neurological: She is alert and oriented to person, place, and time. She has normal reflexes.  Skin: Skin is warm and dry.  Psychiatric: She has a normal mood and affect. Her behavior is normal. Judgment and thought content normal.    Prenatal labs: ABO, Rh: --/--/B POS (06/17 0748) Antibody: NEG (06/17 0748) Rubella: <0.90 (03/26 0919) RPR: Non Reactive (03/26 0919)  HBsAg: Negative (03/26 0919)  HIV: Non Reactive (03/26 0919)  GBS: Negative (05/30 0000)   Assessment/Plan: Admit pit induction of labor for A2GDM   Alicia Pollard 04/20/2017, 9:35 AM

## 2017-04-20 NOTE — Progress Notes (Signed)
S: [name] is a 26 y.o. [phone] with 2 previous term vaginal deliveries @[redacted]w[redacted]d  admitted for IOL for GDM on glyburide.  She denies pain, LOF, VB and reports good fetal movement.  O: Ht 5\' 9"  (1.753 m)   Wt (!) 325 lb (147.4 kg)   LMP 07/31/2016 (Approximate)   BMI 47.99 kg/m    VS reviewed, nursing note reviewed,  Constitutional: well developed, well nourished, no distress HEENT: normocephalic CV: normal rate Pulm/chest wall: normal effort Abdomen: soft Neuro: alert and oriented x 3 Skin: warm, dry Psych: affect normal  Cervix 2/long/-3 per RN exam  Vertex by my bedside US  A: [id number] @[redacted]w[redacted]d  IOL for GDM on glyburide   P: Cytotec 25 mcg Q 4 hours PV Orders placed  Sharen CounterLisa Leftwich-Kirby, CNM 8:32 AM

## 2017-04-20 NOTE — Progress Notes (Signed)
Alicia Pollard is a 26 y.o. [id number] at 5135w0d  admitted for induction of labor due to Gestational diabetes.  Subjective: Patient reporting more pressure in rectum. Recently got epidural and comfortable now.   Objective: Vitals:   04/20/17 2036 04/20/17 2041 04/20/17 2046 04/20/17 2051  BP: (!) 72/48 105/71 (!) 114/53 (!) 91/51  Pulse: (!) 111 (!) 108 92   Resp:      Temp:      TempSrc:      Weight:      Height:       FHT:  FHR: 135 bpm, variability: moderate,  accelerations:  Present,  decelerations:  Absent UC:   regular, every 1-2 minutes SVE:  Dilation: 10  Effacement: 100%  Station: -3  BBOW  Exam by: C. Raianna Slight SNM Pitocin @ 12 mu/min  Labs: Lab Results  Component Value Date   WBC 10.7 (H) 04/20/2017   HGB 9.8 (L) 04/20/2017   HCT 30.0 (L) 04/20/2017   MCV 75.4 (L) 04/20/2017   PLT 383 04/20/2017    Assessment / Plan: IUP at 39 weeks. IOL for GDMA2. GBS neg  Large bulging bag of water felt on exam with no cervix, head still -3 station and not well applied. Will wait and allow patient to labor down.  Plan: Continue pitocin. Receheck in 1-2 hours. Attempt AROM when head is well applied. Anticipate NSVD soon.    Cleone SlimCaroline Gisselle Galvis SNM 04/20/2017, 9:02 PM

## 2017-04-20 NOTE — Progress Notes (Signed)
Alicia HeaterYasmine Pollard is a 26 y.o. G4W1027G5P2022 at 7160w0d by ultrasound admitted for induction of labor due to Gestational diabetes.  Subjective:   Objective: BP (!) 134/55   Pulse 84   Temp 98.6 F (37 C) (Oral)   Resp 17   Ht 5\' 9"  (1.753 m)   Wt (!) 325 lb (147.4 kg)   LMP 07/31/2016 (Approximate)   BMI 47.99 kg/m  No intake/output data recorded. No intake/output data recorded.  FHT:  FHR: 150 bpm, variability: moderate,  accelerations:  Present,  decelerations:  Absent UC:   irregular, every 4-7 minutes and mild SVE:   Dilation: 2.5 Effacement (%): 50, 60 Station: -3 Exam by:: stone rnc  Labs: Lab Results  Component Value Date   WBC 10.7 (H) 04/20/2017   HGB 9.8 (L) 04/20/2017   HCT 30.0 (L) 04/20/2017   MCV 75.4 (L) 04/20/2017   PLT 383 04/20/2017    Assessment / Plan: Induction of labor due to gestational diabetes,  progressing well on pitocin  Labor: yet to be in labor Preeclampsia:  no signs or symptoms of toxicity Fetal Wellbeing:  Category I Pain Control:  Labor support without medications I/D:  n/a Anticipated MOD:  NSVD  Alicia Pollard 04/20/2017, 3:27 PM

## 2017-04-20 NOTE — Anesthesia Preprocedure Evaluation (Signed)
Anesthesia Evaluation  Patient identified by MRN, date of birth, ID band Patient awake    Reviewed: Allergy & Precautions, H&P , NPO status , Patient's Chart, lab work & pertinent test results  Airway Mallampati: II   Neck ROM: full    Dental   Pulmonary former smoker,    breath sounds clear to auscultation       Cardiovascular negative cardio ROS   Rhythm:regular Rate:Normal     Neuro/Psych    GI/Hepatic   Endo/Other  diabetes, GestationalMorbid obesity  Renal/GU      Musculoskeletal   Abdominal   Peds  Hematology   Anesthesia Other Findings   Reproductive/Obstetrics (+) Pregnancy                             Anesthesia Physical Anesthesia Plan  ASA: II  Anesthesia Plan: Epidural   Post-op Pain Management:    Induction: Intravenous  PONV Risk Score and Plan: 2 and Treatment may vary due to age or medical condition  Airway Management Planned: Natural Airway  Additional Equipment:   Intra-op Plan:   Post-operative Plan:   Informed Consent: I have reviewed the patients History and Physical, chart, labs and discussed the procedure including the risks, benefits and alternatives for the proposed anesthesia with the patient or authorized representative who has indicated his/her understanding and acceptance.     Plan Discussed with: Anesthesiologist  Anesthesia Plan Comments:         Anesthesia Quick Evaluation

## 2017-04-20 NOTE — Progress Notes (Signed)
Alicia HeaterYasmine Pollard is a 26 y.o. [id number] at [redacted]w[redacted]d by ultrasound admitted for induction of labor due to Gestational diabetes.  Subjective:   Objective: BP (!) 122/49   Pulse 86   Temp 97.9 F (36.6 C) (Oral)   Resp 20   Ht 5\' 9"  (1.753 m)   Wt (!) 325 lb (147.4 kg)   LMP 07/31/2016 (Approximate)   BMI 47.99 kg/m  No intake/output data recorded. No intake/output data recorded.  FHT:  FHR: 150's bpm, variability: moderate,  accelerations:  Present,  decelerations:  Absent UC:   irregular, every 4-8 minutes and mild SVE:   Dilation: 2.5 Effacement (%): 50, 60 Station: -3 Exam by:: stone rnc  Labs: Lab Results  Component Value Date   WBC 10.7 (H) 04/20/2017   HGB 9.8 (L) 04/20/2017   HCT 30.0 (L) 04/20/2017   MCV 75.4 (L) 04/20/2017   PLT 383 04/20/2017    Assessment / Plan: Induction of labor due to gestational diabetes,  progressing well on pitocin  Labor: yet to be in labor Preeclampsia:  no signs or symptoms of toxicity Fetal Wellbeing:  Category I Pain Control:  Labor support without medications I/D:  n/a Anticipated MOD:  NSVD  Alicia Pollard 04/20/2017, 6:05 PM

## 2017-04-20 NOTE — Anesthesia Procedure Notes (Signed)
Epidural Patient location during procedure: OB Start time: 04/20/2017 8:18 PM End time: 04/20/2017 8:27 PM  Staffing Anesthesiologist: Chaney MallingHODIERNE, Mccartney Chuba Performed: anesthesiologist   Preanesthetic Checklist Completed: patient identified, site marked, pre-op evaluation, timeout performed, IV checked, risks and benefits discussed and monitors and equipment checked  Epidural Patient position: sitting Prep: DuraPrep Patient monitoring: heart rate, cardiac monitor, continuous pulse ox and blood pressure Approach: midline Location: L2-L3 Injection technique: LOR saline  Needle:  Needle type: Tuohy  Needle gauge: 17 G Needle length: 9 cm Needle insertion depth: 8 cm Catheter type: closed end flexible Catheter size: 19 Gauge Catheter at skin depth: 13 cm Test dose: negative and Other  Assessment Events: blood not aspirated, injection not painful, no injection resistance and negative IV test  Additional Notes Informed consent obtained prior to proceeding including risk of failure, 1% risk of PDPH, risk of minor discomfort and bruising.  Discussed rare but serious complications including epidural abscess, permanent nerve injury, epidural hematoma.  Discussed alternatives to epidural analgesia and patient desires to proceed.  Timeout performed pre-procedure verifying patient name, procedure, and platelet count.  Patient tolerated procedure well. Reason for block:procedure for pain

## 2017-04-20 NOTE — Anesthesia Pain Management Evaluation Note (Signed)
  CRNA Pain Management Visit Note  Patient: Alicia HeaterYasmine Saathoff, 26 y.o., female  "Hello [facility]. We have an anesthesia team available at all times to provide care throughout the hospital, including epidural management and anesthesia for C-section. I don't know your plan for the delivery whether it a natural birth, water birth, IV sedation, nitrous supplementation, doula or epidural, but we want to meet your pain goals."   1.Was your pain managed to your expectations on prior hospitalizations?   No   2.What is your expectation for pain management during this hospitalization?     Epidural  3.How can we help you reach that goal? Support prn  Record the patient's initial score and the patient's pain goal.   Pain: 0  Pain Goal: 7 [facility] wants you to be able to say your pain was always managed very well.  [facility] CenterWRINKLE,Ghalia Reicks 04/20/2017

## 2017-04-20 NOTE — Progress Notes (Signed)
Alicia HeaterYasmine Pollard is a 26 y.o. [id number] at 664w0d  admitted for induction of labor due to Gestational diabetes.  Subjective:  comfortable with epidural. No questions  Objective: BP 126/70   Pulse 85   Temp 98 F (36.7 C) (Oral)   Resp 16   Ht 5\' 9"  (1.753 m)   Wt (!) 325 lb (147.4 kg)   LMP 07/31/2016 (Approximate)   SpO2 98%   BMI 47.99 kg/m  No intake/output data recorded. No intake/output data recorded.  FHT:  FHR: 140 bpm, variability: moderate,  accelerations:  Present,  decelerations:  Absent UC:   regular, every 2-3 minutes, not tracing well with toco  SVE:   Dilation: 7 Effacement (%): 80 Station: -2 Exam by:: H.Hogan, CNM  SROM of thin MSF during exam   Labs: Lab Results  Component Value Date   WBC 10.7 (H) 04/20/2017   HGB 9.8 (L) 04/20/2017   HCT 30.0 (L) 04/20/2017   MCV 75.4 (L) 04/20/2017   PLT 383 04/20/2017    Assessment / Plan: Induction of labor due to gestational diabetes,  progressing well on pitocin  Labor: continue to increase pitocin as needed.  Preeclampsia:  NA Fetal Wellbeing:  Category I Pain Control:  Epidural I/D:  n/a Anticipated MOD:  NSVD  Thressa ShellerHeather Hogan 04/20/2017, 11:21 PM

## 2017-04-21 ENCOUNTER — Encounter (HOSPITAL_COMMUNITY)
Admission: RE | Disposition: A | Discharge status: Home / Self Care | Payer: Self-pay | Source: Ambulatory Visit | Attending: Obstetrics & Gynecology

## 2017-04-21 ENCOUNTER — Inpatient Hospital Stay (HOSPITAL_COMMUNITY): Payer: Medicaid Other | Admitting: Anesthesiology

## 2017-04-21 ENCOUNTER — Encounter (HOSPITAL_COMMUNITY): Payer: Self-pay

## 2017-04-21 DIAGNOSIS — Z302 Encounter for sterilization: Secondary | ICD-10-CM

## 2017-04-21 DIAGNOSIS — O24414 Gestational diabetes mellitus in pregnancy, insulin controlled: Secondary | ICD-10-CM

## 2017-04-21 DIAGNOSIS — Z3A39 39 weeks gestation of pregnancy: Secondary | ICD-10-CM

## 2017-04-21 HISTORY — PX: TUBAL LIGATION: SHX77

## 2017-04-21 LAB — GLUCOSE, CAPILLARY: Glucose-Capillary: 89 mg/dL (ref 65–99)

## 2017-04-21 SURGERY — LIGATION, FALLOPIAN TUBE, POSTPARTUM
Anesthesia: Spinal

## 2017-04-21 MED ORDER — OXYCODONE HCL 5 MG PO TABS
5.0000 mg | ORAL_TABLET | ORAL | Status: DC | PRN
  Administered 2017-04-21 – 2017-04-22 (×3): 5 mg via ORAL
  Filled 2017-04-21 (×3): qty 1

## 2017-04-21 MED ORDER — BENZOCAINE-MENTHOL 20-0.5 % EX AERO: 1.0000 "application " | INHALATION_SPRAY | CUTANEOUS | Status: DC | PRN

## 2017-04-21 MED ORDER — DIPHENHYDRAMINE HCL 25 MG PO CAPS: 25.0000 mg | ORAL_CAPSULE | Freq: Four times a day (QID) | ORAL | Status: DC | PRN

## 2017-04-21 MED ORDER — BUPIVACAINE IN DEXTROSE 0.75-8.25 % IT SOLN
INTRATHECAL | Status: AC
  Filled 2017-04-21: qty 2

## 2017-04-21 MED ORDER — ONDANSETRON HCL 4 MG PO TABS: 4.0000 mg | ORAL_TABLET | ORAL | Status: DC | PRN

## 2017-04-21 MED ORDER — WITCH HAZEL-GLYCERIN EX PADS: 1.0000 "application " | MEDICATED_PAD | CUTANEOUS | Status: DC | PRN

## 2017-04-21 MED ORDER — MIDAZOLAM HCL 2 MG/2ML IJ SOLN
INTRAMUSCULAR | Status: DC | PRN
  Administered 2017-04-21: 2 mg via INTRAVENOUS

## 2017-04-21 MED ORDER — MEASLES, MUMPS & RUBELLA VAC ~~LOC~~ INJ
0.5000 mL | INJECTION | Freq: Once | SUBCUTANEOUS | Status: AC
  Administered 2017-04-22: 0.5 mL via SUBCUTANEOUS
  Filled 2017-04-21 (×2): qty 0.5

## 2017-04-21 MED ORDER — FAMOTIDINE 20 MG PO TABS
40.0000 mg | ORAL_TABLET | Freq: Once | ORAL | Status: AC
  Administered 2017-04-21: 40 mg via ORAL
  Filled 2017-04-21: qty 2

## 2017-04-21 MED ORDER — BUPIVACAINE IN DEXTROSE 0.75-8.25 % IT SOLN
INTRATHECAL | Status: DC | PRN
  Administered 2017-04-21: 1.6 mL via INTRATHECAL

## 2017-04-21 MED ORDER — METHYLERGONOVINE MALEATE 0.2 MG PO TABS: 0.2000 mg | ORAL_TABLET | ORAL | Status: DC | PRN

## 2017-04-21 MED ORDER — OXYCODONE HCL 5 MG PO TABS: 10.0000 mg | ORAL_TABLET | ORAL | Status: DC | PRN

## 2017-04-21 MED ORDER — SCOPOLAMINE 1 MG/3DAYS TD PT72
MEDICATED_PATCH | TRANSDERMAL | Status: DC | PRN
  Administered 2017-04-21: 1 via TRANSDERMAL

## 2017-04-21 MED ORDER — BUPIVACAINE HCL (PF) 0.25 % IJ SOLN
INTRAMUSCULAR | Status: AC
  Filled 2017-04-21: qty 10

## 2017-04-21 MED ORDER — OXYCODONE-ACETAMINOPHEN 5-325 MG PO TABS: 1.0000 | ORAL_TABLET | ORAL | Status: DC | PRN

## 2017-04-21 MED ORDER — SODIUM BICARBONATE 8.4 % IV SOLN
INTRAVENOUS | Status: DC | PRN
  Administered 2017-04-21: 10 mL via EPIDURAL

## 2017-04-21 MED ORDER — FENTANYL CITRATE (PF) 100 MCG/2ML IJ SOLN
INTRAMUSCULAR | Status: AC
  Filled 2017-04-21: qty 2

## 2017-04-21 MED ORDER — METOCLOPRAMIDE HCL 10 MG PO TABS
10.0000 mg | ORAL_TABLET | Freq: Once | ORAL | Status: AC
  Administered 2017-04-21: 10 mg via ORAL
  Filled 2017-04-21: qty 1

## 2017-04-21 MED ORDER — MEPERIDINE HCL 25 MG/ML IJ SOLN: 6.2500 mg | INTRAMUSCULAR | Status: DC | PRN

## 2017-04-21 MED ORDER — PROMETHAZINE HCL 25 MG/ML IJ SOLN: 6.2500 mg | INTRAMUSCULAR | Status: DC | PRN

## 2017-04-21 MED ORDER — ONDANSETRON HCL 4 MG/2ML IJ SOLN: 4.0000 mg | INTRAMUSCULAR | Status: DC | PRN

## 2017-04-21 MED ORDER — BUPIVACAINE HCL (PF) 0.25 % IJ SOLN
INTRAMUSCULAR | Status: DC | PRN
  Administered 2017-04-21: 10 mL

## 2017-04-21 MED ORDER — SIMETHICONE 80 MG PO CHEW: 80.0000 mg | CHEWABLE_TABLET | ORAL | Status: DC | PRN

## 2017-04-21 MED ORDER — SODIUM BICARBONATE 8.4 % IV SOLN
INTRAVENOUS | Status: AC
  Filled 2017-04-21: qty 50

## 2017-04-21 MED ORDER — ACETAMINOPHEN 325 MG PO TABS
650.0000 mg | ORAL_TABLET | ORAL | Status: DC | PRN
  Administered 2017-04-21 – 2017-04-22 (×3): 650 mg via ORAL
  Filled 2017-04-21 (×3): qty 2

## 2017-04-21 MED ORDER — TETANUS-DIPHTH-ACELL PERTUSSIS 5-2.5-18.5 LF-MCG/0.5 IM SUSP: 0.5000 mL | Freq: Once | INTRAMUSCULAR | Status: DC

## 2017-04-21 MED ORDER — ONDANSETRON HCL 4 MG/2ML IJ SOLN
INTRAMUSCULAR | Status: AC
  Filled 2017-04-21: qty 2

## 2017-04-21 MED ORDER — COCONUT OIL OIL: 1.0000 "application " | TOPICAL_OIL | Status: DC | PRN

## 2017-04-21 MED ORDER — MIDAZOLAM HCL 2 MG/2ML IJ SOLN
INTRAMUSCULAR | Status: AC
  Filled 2017-04-21: qty 2

## 2017-04-21 MED ORDER — FENTANYL CITRATE (PF) 100 MCG/2ML IJ SOLN
INTRAMUSCULAR | Status: DC | PRN
  Administered 2017-04-21 (×2): 50 ug via INTRAVENOUS

## 2017-04-21 MED ORDER — KETOROLAC TROMETHAMINE 30 MG/ML IJ SOLN
INTRAMUSCULAR | Status: DC | PRN
  Administered 2017-04-21: 30 mg via INTRAVENOUS

## 2017-04-21 MED ORDER — DEXAMETHASONE SODIUM PHOSPHATE 4 MG/ML IJ SOLN
INTRAMUSCULAR | Status: DC | PRN
  Administered 2017-04-21: 4 mg via INTRAVENOUS

## 2017-04-21 MED ORDER — SODIUM CHLORIDE 0.9 % IR SOLN
Status: DC | PRN
  Administered 2017-04-21: 1000 mL

## 2017-04-21 MED ORDER — LACTATED RINGERS IV SOLN
INTRAVENOUS | Status: DC
  Administered 2017-04-21: 1 mL via INTRAVENOUS
  Administered 2017-04-21 (×2): via INTRAVENOUS

## 2017-04-21 MED ORDER — KETOROLAC TROMETHAMINE 30 MG/ML IJ SOLN: 30.0000 mg | Freq: Once | INTRAMUSCULAR | Status: DC | PRN

## 2017-04-21 MED ORDER — DIBUCAINE 1 % RE OINT: 1.0000 "application " | TOPICAL_OINTMENT | RECTAL | Status: DC | PRN

## 2017-04-21 MED ORDER — HYDROMORPHONE HCL 1 MG/ML IJ SOLN: 0.2500 mg | INTRAMUSCULAR | Status: DC | PRN

## 2017-04-21 MED ORDER — SENNOSIDES-DOCUSATE SODIUM 8.6-50 MG PO TABS
2.0000 | ORAL_TABLET | ORAL | Status: DC
  Administered 2017-04-21: 2 via ORAL
  Filled 2017-04-21: qty 2

## 2017-04-21 MED ORDER — DEXAMETHASONE SODIUM PHOSPHATE 4 MG/ML IJ SOLN
INTRAMUSCULAR | Status: AC
  Filled 2017-04-21: qty 1

## 2017-04-21 MED ORDER — SCOPOLAMINE 1 MG/3DAYS TD PT72
MEDICATED_PATCH | TRANSDERMAL | Status: AC
  Filled 2017-04-21: qty 1

## 2017-04-21 MED ORDER — IBUPROFEN 600 MG PO TABS
600.0000 mg | ORAL_TABLET | Freq: Four times a day (QID) | ORAL | Status: DC
  Administered 2017-04-21 – 2017-04-22 (×4): 600 mg via ORAL
  Filled 2017-04-21 (×4): qty 1

## 2017-04-21 MED ORDER — ONDANSETRON HCL 4 MG/2ML IJ SOLN
INTRAMUSCULAR | Status: DC | PRN
  Administered 2017-04-21: 4 mg via INTRAVENOUS

## 2017-04-21 MED ORDER — METHYLERGONOVINE MALEATE 0.2 MG/ML IJ SOLN: 0.2000 mg | INTRAMUSCULAR | Status: DC | PRN

## 2017-04-21 MED ORDER — ZOLPIDEM TARTRATE 5 MG PO TABS: 5.0000 mg | ORAL_TABLET | Freq: Every evening | ORAL | Status: DC | PRN

## 2017-04-21 MED ORDER — KETOROLAC TROMETHAMINE 30 MG/ML IJ SOLN
INTRAMUSCULAR | Status: AC
  Filled 2017-04-21: qty 1

## 2017-04-21 MED ORDER — LIDOCAINE-EPINEPHRINE (PF) 2 %-1:200000 IJ SOLN
INTRAMUSCULAR | Status: DC | PRN
Start: 2017-04-21 — End: 2017-04-21

## 2017-04-21 MED ORDER — PRENATAL MULTIVITAMIN CH: 1.0000 | ORAL_TABLET | Freq: Every day | ORAL | Status: DC

## 2017-04-21 SURGICAL SUPPLY — 29 items
CLIP FILSHIE TUBAL LIGA STRL (Clip) ×3 IMPLANT
CLOTH BEACON ORANGE TIMEOUT ST (SAFETY) ×3 IMPLANT
DRSG OPSITE POSTOP 3X4 (GAUZE/BANDAGES/DRESSINGS) ×3 IMPLANT
DURAPREP 26ML APPLICATOR (WOUND CARE) ×3 IMPLANT
GLOVE BIO SURGEON STRL SZ7.5 (GLOVE) ×3 IMPLANT
GLOVE BIOGEL PI IND STRL 6 (GLOVE) ×1 IMPLANT
GLOVE BIOGEL PI IND STRL 7.0 (GLOVE) ×2 IMPLANT
GLOVE BIOGEL PI INDICATOR 6 (GLOVE) ×2
GLOVE BIOGEL PI INDICATOR 7.0 (GLOVE) ×4
GLOVE ECLIPSE 6.0 STRL STRAW (GLOVE) ×3 IMPLANT
GOWN STRL REUS W/TWL LRG LVL3 (GOWN DISPOSABLE) ×6 IMPLANT
GOWN STRL REUS W/TWL XL LVL3 (GOWN DISPOSABLE) ×3 IMPLANT
HOVERMATT SINGLE USE (MISCELLANEOUS) ×3 IMPLANT
NEEDLE HYPO 22GX1.5 SAFETY (NEEDLE) ×3 IMPLANT
NS IRRIG 1000ML POUR BTL (IV SOLUTION) ×3 IMPLANT
PACK ABDOMINAL MINOR (CUSTOM PROCEDURE TRAY) ×3 IMPLANT
PROTECTOR NERVE ULNAR (MISCELLANEOUS) ×3 IMPLANT
RETRACTOR WOUND ALXS 19CM XSML (INSTRUMENTS) ×1 IMPLANT
RTRCTR WOUND ALEXIS 19CM XSML (INSTRUMENTS) ×3
SPONGE LAP 4X18 X RAY DECT (DISPOSABLE) IMPLANT
SUT MNCRL AB 4-0 PS2 18 (SUTURE) ×3 IMPLANT
SUT PLAIN 0 NONE (SUTURE) ×3 IMPLANT
SUT VIC AB 0 CT1 27 (SUTURE) ×2
SUT VIC AB 0 CT1 27XBRD ANBCTR (SUTURE) ×1 IMPLANT
SUT VIC AB 2-0 UR5 27 (SUTURE) ×6 IMPLANT
SYR CONTROL 10ML LL (SYRINGE) ×3 IMPLANT
TOWEL OR 17X24 6PK STRL BLUE (TOWEL DISPOSABLE) ×6 IMPLANT
TRAY FOLEY CATH SILVER 14FR (SET/KITS/TRAYS/PACK) ×3 IMPLANT
WATER STERILE IRR 1000ML POUR (IV SOLUTION) ×3 IMPLANT

## 2017-04-21 NOTE — Anesthesia Postprocedure Evaluation (Signed)
Anesthesia Post Note  Patient: Alicia HeaterYasmine Parsell  Procedure(s) Performed: * No procedures listed *     Patient location during evaluation: Mother Baby Anesthesia Type: Epidural Level of consciousness: awake and alert Pain management: pain level controlled Vital Signs Assessment: post-procedure vital signs reviewed and stable Respiratory status: spontaneous breathing, nonlabored ventilation and respiratory function stable Cardiovascular status: stable Postop Assessment: no headache, no backache and epidural receding Anesthetic complications: no    Last Vitals:  Vitals:   04/21/17 0233 04/21/17 0330  BP: 120/61 111/61  Pulse: 97 (!) 102  Resp: 16 16  Temp: 37 C 37 C    Last Pain:  Vitals:   04/21/17 0635  TempSrc:   PainSc: 3    Pain Goal: Patients Stated Pain Goal: 3 (04/20/17 2008)               Junious SilkGILBERT,Keslee Harrington

## 2017-04-21 NOTE — Op Note (Signed)
  Alicia HeaterYasmine Pollard 04/20/2017 - 04/21/2017  PREOPERATIVE DIAGNOSIS:  Undesired fertility  POSTOPERATIVE DIAGNOSIS:  Undesired fertility, bilateral tubal ligation using Filshie Clips  PROCEDURE:  Postpartum Bilateral Tubal Sterilization using Infraumbilical Filshie Clips   SURGEON:  Dr Nettie ElmMichael Ervin Dr Jen MowElizabeth Rayford Williamsen - OB Fellow  ANESTHESIA:  Spinal  COMPLICATIONS:  None immediate.  ESTIMATED BLOOD LOSS:  Less than 20cc.  FLUIDS: 1400 cc LR.  URINE OUTPUT:  20 cc of clear urine.  INDICATIONS: [age] y.o. yo [name]  with undesired fertility,status post vaginal delivery, desires permanent sterilization. Risks and benefits of procedure discussed with patient including permanence of method, bleeding, infection, injury to surrounding organs and need for additional procedures. Risk failure of 0.5-1% with increased risk of ectopic gestation if pregnancy occurs was also discussed with patient.   FINDINGS:  Normal uterus, tubes, and ovaries.   TECHNIQUE:  The patient was taken to the operating room where her epidural anesthesia was dosed up to surgical level and found to be adequate.  She was then placed in the dorsal supine position and prepped and draped in sterile fashion.  After an adequate timeout was performed, attention was turned to the patient's abdomen where a small transverse skin incision was made under the umbilical fold. The incision was taken down to the layer of fascia using the scalpel, and fascia was incised, and extended bilaterally using Mayo scissors. The peritoneum was entered in a sharp fashion. An Facilities managerX-small Alexis retractor was placed. Attention was then turned to the patient's uterus, and left fallopian tube was identified and followed out to the fimbriated end. The left fallopian tube was grasped and a Filshie Clip was placed within 2cm distally to the isthmic and ampullary region portion of the tube.  There was good hemostasis. The same was performed on the right fallopian tube.   There was good hemostasis.The instruments were then removed from the patient's abdomen and the fascial incision was repaired with 0 Vicryl, subcutaneous layer was reapproximated with 0 Vicryl, and the skin was closed with a 4-0 Monocryl subcuticular stitch. The patient tolerated the procedure well.  Sponge, lap, and needle counts were correct times two.  The patient was then taken to the recovery room awake, extubated and in stable condition.   Jen MowElizabeth Gael Londo, DO OB Fellow 04/21/2017 12:00 PM

## 2017-04-21 NOTE — Progress Notes (Signed)
This nurse spoke with Thressa ShellerHeather Hogan, CNM, about scheduling of BTL patient is supposed to have today. No time has been set yet; will do so at rounds this a.m.  Jtwells, rn

## 2017-04-21 NOTE — Anesthesia Procedure Notes (Signed)
Spinal  Start time: 04/21/2017 11:10 AM End time: 04/21/2017 11:14 AM Staffing Anesthesiologist: Leilani AbleHATCHETT, Mahathi Pokorney Performed: anesthesiologist  Preanesthetic Checklist Completed: patient identified, surgical consent, pre-op evaluation, timeout performed, IV checked, risks and benefits discussed and monitors and equipment checked Spinal Block Patient position: sitting Prep: site prepped and draped and DuraPrep Patient monitoring: heart rate, cardiac monitor, continuous pulse ox and blood pressure Approach: midline Location: L3-4 Injection technique: single-shot Needle Needle type: Pencan  Needle gauge: 24 G Needle insertion depth: 8 cm Assessment Sensory level: T6

## 2017-04-21 NOTE — Anesthesia Preprocedure Evaluation (Signed)
Anesthesia Evaluation  Patient identified by MRN, date of birth, ID band Patient awake    Reviewed: Allergy & Precautions, NPO status , Patient's Chart, lab work & pertinent test results  Airway Mallampati: III       Dental no notable dental hx. (+) Teeth Intact   Pulmonary former smoker,    Pulmonary exam normal breath sounds clear to auscultation       Cardiovascular Normal cardiovascular exam Rhythm:Regular Rate:Normal     Neuro/Psych negative neurological ROS  negative psych ROS   GI/Hepatic negative GI ROS, Neg liver ROS,   Endo/Other  diabetesMorbid obesity  Renal/GU negative Renal ROS  negative genitourinary   Musculoskeletal negative musculoskeletal ROS (+)   Abdominal (+) + obese,   Peds  Hematology negative hematology ROS (+)   Anesthesia Other Findings   Reproductive/Obstetrics                             Anesthesia Physical Anesthesia Plan  ASA: III  Anesthesia Plan: Spinal   Post-op Pain Management:    Induction:   PONV Risk Score and Plan: 3 and Ondansetron, Dexamethasone, Propofol and Midazolam  Airway Management Planned: Natural Airway and Nasal Cannula  Additional Equipment:   Intra-op Plan:   Post-operative Plan:   Informed Consent: I have reviewed the patients History and Physical, chart, labs and discussed the procedure including the risks, benefits and alternatives for the proposed anesthesia with the patient or authorized representative who has indicated his/her understanding and acceptance.     Plan Discussed with: Surgeon and CRNA  Anesthesia Plan Comments:         Anesthesia Quick Evaluation

## 2017-04-21 NOTE — Transfer of Care (Signed)
Immediate Anesthesia Transfer of Care Note  Patient: Alicia HeaterYasmine Pollard  Procedure(s) Performed: Procedure(s): POST PARTUM TUBAL LIGATION (N/A)  Patient Location: PACU  Anesthesia Type:Spinal  Level of Consciousness: awake, alert  and oriented  Airway & Oxygen Therapy: Patient Spontanous Breathing  Post-op Assessment: Report given to RN and Post -op Vital signs reviewed and stable  Post vital signs: Reviewed and stable  Last Vitals:  Vitals:   04/21/17 0233 04/21/17 0330  BP: 120/61 111/61  Pulse: 97 (!) 102  Resp: 16 16  Temp: 37 C 37 C    Last Pain:  Vitals:   04/21/17 0745  TempSrc:   PainSc: 2       Patients Stated Pain Goal: 3 (04/20/17 2008)  Complications: No apparent anesthesia complications

## 2017-04-21 NOTE — Progress Notes (Signed)
Patient ID: [name], female   DOB: 20-Aug-1991, 26 y.o.   MRN: [id number] Pt desires PPBTL. NPO and Epidural in place Papers signed 01/24/17 and are on pt's chart No history of abd/pelvic surgery Risk of obesity,  BMI > 46 reviewed with pt.  R/B/Post op care and failure rate reviewed with pt.  Pt verbalized understanding and desires to proceed.   Nursing aware of surgery time for @ 11:00 AM today Continue NPO status

## 2017-04-21 NOTE — Anesthesia Postprocedure Evaluation (Signed)
Anesthesia Post Note  Patient: Alicia HeaterYasmine Pollard  Procedure(s) Performed: Procedure(s) (LRB): POST PARTUM TUBAL LIGATION (N/A)     Patient location during evaluation: PACU Anesthesia Type: Spinal Level of consciousness: awake Pain management: pain level controlled Vital Signs Assessment: post-procedure vital signs reviewed and stable Respiratory status: spontaneous breathing Cardiovascular status: stable Postop Assessment: no headache, no backache, spinal receding, patient able to bend at knees and no signs of nausea or vomiting Anesthetic complications: no    Last Vitals:  Vitals:   04/21/17 1209 04/21/17 1230  BP: 115/63 116/65  Pulse: 88 78  Resp: 15 20  Temp: 36.6 C     Last Pain:  Vitals:   04/21/17 0745  TempSrc:   PainSc: 2    Pain Goal: Patients Stated Pain Goal: 3 (04/20/17 2008)               Lawrence Roldan JR,JOHN Susann GivensFRANKLIN

## 2017-04-22 DIAGNOSIS — Z9851 Tubal ligation status: Secondary | ICD-10-CM

## 2017-04-22 MED ORDER — IBUPROFEN 600 MG PO TABS: 600.0000 mg | ORAL_TABLET | Freq: Four times a day (QID) | ORAL | 0 refills | Status: DC

## 2017-04-22 MED ORDER — OXYCODONE-ACETAMINOPHEN 5-325 MG PO TABS: 1.0000 | ORAL_TABLET | Freq: Four times a day (QID) | ORAL | 0 refills | Status: AC | PRN

## 2017-04-22 MED ORDER — DOCUSATE SODIUM 100 MG PO CAPS: 100.0000 mg | ORAL_CAPSULE | Freq: Two times a day (BID) | ORAL | 0 refills | Status: AC

## 2017-04-22 NOTE — Discharge Instructions (Signed)
Vaginal Delivery, Care After °Refer to this sheet in the next few weeks. These instructions provide you with information about caring for yourself after vaginal delivery. Your health care provider may also give you more specific instructions. Your treatment has been planned according to current medical practices, but problems sometimes occur. Call your health care provider if you have any problems or questions. °What can I expect after the procedure? °After vaginal delivery, it is common to have: °· Some bleeding from your vagina. °· Soreness in your abdomen, your vagina, and the area of skin between your vaginal opening and your anus (perineum). °· Pelvic cramps. °· Fatigue. ° °Follow these instructions at home: °Medicines °· Take over-the-counter and prescription medicines only as told by your health care provider. °· If you were prescribed an antibiotic medicine, take it as told by your health care provider. Do not stop taking the antibiotic until it is finished. °Driving ° °· Do not drive or operate heavy machinery while taking prescription pain medicine. °· Do not drive for 24 hours if you received a sedative. °Lifestyle °· Do not drink alcohol. This is especially important if you are breastfeeding or taking medicine to relieve pain. °· Do not use tobacco products, including cigarettes, chewing tobacco, or e-cigarettes. If you need help quitting, ask your health care provider. °Eating and drinking °· Drink at least 8 eight-ounce glasses of water every day unless you are told not to by your health care provider. If you choose to breastfeed your baby, you may need to drink more water than this. °· Eat high-fiber foods every day. These foods may help prevent or relieve constipation. High-fiber foods include: °? Whole grain cereals and breads. °? Brown rice. °? Beans. °? Fresh fruits and vegetables. °Activity °· Return to your normal activities as told by your health care provider. Ask your health care provider  what activities are safe for you. °· Rest as much as possible. Try to rest or take a nap when your baby is sleeping. °· Do not lift anything that is heavier than your baby or 10 lb (4.5 kg) until your health care provider says that it is safe. °· Talk with your health care provider about when you can engage in sexual activity. This may depend on your: °? Risk of infection. °? Rate of healing. °? Comfort and desire to engage in sexual activity. °Vaginal Care °· If you have an episiotomy or a vaginal tear, check the area every day for signs of infection. Check for: °? More redness, swelling, or pain. °? More fluid or blood. °? Warmth. °? Pus or a bad smell. °· Do not use tampons or douches until your health care provider says this is safe. °· Watch for any blood clots that may pass from your vagina. These may look like clumps of dark red, brown, or black discharge. °General instructions °· Keep your perineum clean and dry as told by your health care provider. °· Wear loose, comfortable clothing. °· Wipe from front to back when you use the toilet. °· Ask your health care provider if you can shower or take a bath. If you had an episiotomy or a perineal tear during labor and delivery, your health care provider may tell you not to take baths for a certain length of time. °· Wear a bra that supports your breasts and fits you well. °· If possible, have someone help you with household activities and help care for your baby for at least a few days after   you leave the hospital.  Keep all follow-up visits for you and your baby as told by your health care provider. This is important. Contact a health care provider if:  You have: ? Vaginal discharge that has a bad smell. ? Difficulty urinating. ? Pain when urinating. ? A sudden increase or decrease in the frequency of your bowel movements. ? More redness, swelling, or pain around your episiotomy or vaginal tear. ? More fluid or blood coming from your episiotomy or  vaginal tear. ? Pus or a bad smell coming from your episiotomy or vaginal tear. ? A fever. ? A rash. ? Little or no interest in activities you used to enjoy. ? Questions about caring for yourself or your baby.  Your episiotomy or vaginal tear feels warm to the touch.  Your episiotomy or vaginal tear is separating or does not appear to be healing.  Your breasts are painful, hard, or turn red.  You feel unusually sad or worried.  You feel nauseous or you vomit.  You pass large blood clots from your vagina. If you pass a blood clot from your vagina, save it to show to your health care provider. Do not flush blood clots down the toilet without having your health care provider look at them.  You urinate more than usual.  You are dizzy or light-headed.  You have not breastfed at all and you have not had a menstrual period for 12 weeks after delivery.  You have stopped breastfeeding and you have not had a menstrual period for 12 weeks after you stopped breastfeeding. Get help right away if:  You have: ? Pain that does not go away or does not get better with medicine. ? Chest pain. ? Difficulty breathing. ? Blurred vision or spots in your vision. ? Thoughts about hurting yourself or your baby.  You develop pain in your abdomen or in one of your legs.  You develop a severe headache.  You faint.  You bleed from your vagina so much that you fill two sanitary pads in one hour. This information is not intended to replace advice given to you by your health care provider. Make sure you discuss any questions you have with your health care provider. Document Released: 10/18/2000 Document Revised: 04/03/2016 Document Reviewed: 11/05/2015 Elsevier Interactive Patient Education  2017 Elsevier Inc.   Postpartum Tubal Ligation, Care After Refer to this sheet in the next few weeks. These instructions provide you with information about caring for yourself after your procedure. Your health  care provider may also give you more specific instructions. Your treatment has been planned according to current medical practices, but problems sometimes occur. Call your health care provider if you have any problems or questions after your procedure. What can I expect after the procedure? After the procedure, it is common to have:  A sore throat.  Bruising or pain in your back.  Nausea or vomiting.  Dizziness.  Mild abdominal discomfort or pain, such as cramping, gas pain, or feeling bloated.  Soreness where the incision was made.  Tiredness.  Pain in your shoulders.  Follow these instructions at home: Medicines  Take over-the-counter and prescription medicines only as told by your health care provider.  Do not take aspirin because it can cause bleeding.  Do not drive or operate heavy machinery while taking prescription pain medicine. Activity  Rest for the rest of the day.  Gradually return to your normal activities over the next few days.  Do not have sex, douche,  or put a tampon or anything else in your vagina for 6 weeks or as long as told by your health care provider. °· Do not lift anything that is heavier than your baby for 2 weeks or as long as told by your health care provider. °Incision care °· Follow instructions from your health care provider about how to take care of your incision. Make sure you: °? Wash your hands with soap and water before you change your bandage (dressing). If soap and water are not available, use hand sanitizer. °? Change your dressing as told by your health care provider. °? Leave stitches (sutures) in place. They may need to stay in place for 2 weeks or longer. °· Check your incision area every day for signs of infection. Check for: °? More redness, swelling, or pain. °? More fluid or blood. °? Warmth. °? Pus or a bad smell. °Other Instructions °· Do not take baths, swim, or use a hot tub until your health care provider approves. You may take  showers. °· Keep all follow-up visits as told by your health care provider. This is important. °Contact a health care provider if: °· You have more redness, swelling, or pain around your incision. °· Your incision feels warm to the touch. °· You have pus or a bad smell coming from your incision. °· The edges of your incision break open after the sutures have been removed. °· Your pain does not improve after 2-3 days. °· You have a rash. °· You repeatedly become dizzy or lightheaded. °· Your pain medicine is not helping. °· You are constipated. °Get help right away if: °· You have a fever. °· You faint. °· You have pain in your abdomen that gets worse. °· You have fluid or blood coming from your sutures. °· You have shortness of breath or difficulty breathing. °· You have chest pain or leg pain. °· You have ongoing nausea or diarrhea. °This information is not intended to replace advice given to you by your health care provider. Make sure you discuss any questions you have with your health care provider. °Document Released: 04/21/2012 Document Revised: 03/25/2016 Document Reviewed: 10/01/2015 °Elsevier Interactive Patient Education © 2018 Elsevier Inc. ° °

## 2017-04-22 NOTE — Discharge Summary (Signed)
OB Discharge Summary     Patient Name: Alicia Pollard DOB: August 12, 1991 MRN: [id number]  Date of admission: 04/20/2017 Delivering MD: Wende Mott   Date of discharge: 04/22/2017  Admitting diagnosis: 39wks induction  PP BTL Intrauterine pregnancy: [redacted]w[redacted]d    Secondary diagnosis:  Principal Problem:   NSVD (normal spontaneous vaginal delivery) Active Problems:   Gestational diabetes mellitus (GDM) controlled on oral hypoglycemic drug   Status post tubal ligation  Additional problems: None     Discharge diagnosis: Term Pregnancy Delivered and GDM A2                                                                                                Post partum procedures:Postpartum tubal ligation;  MMR Vaccine  Augmentation: Pitocin  Complications: None  Hospital course:  Induction of Labor With Vaginal Delivery   26y.o. yo [id number] admitted to the hospital 04/20/2017 for induction of labor.  Indication for induction: A2 DM.  Patient had an uncomplicated labor course as follows: Membrane Rupture Time/Date: 11:13 PM ,04/20/2017   Intrapartum Procedures: Episiotomy: None [1]                                         Lacerations:     Patient had delivery of a Viable infant.  Information for the patient's newborn:  CoMeriam Sprague0[[id number]]Delivery Method: Vag-Spont   04/21/2017  Details of delivery can be found in separate delivery note.  Patient had a routine postpartum course. Patient is discharged home 04/28/17.  Physical exam  Vitals:   04/21/17 1530 04/21/17 1830 04/21/17 2302 04/22/17 0325  BP: 127/68 136/78 (!) 117/58 113/71  Pulse: 80 92 88 71  Resp: 18 18 18 17   Temp: 98.5 F (36.9 C) 98.5 F (36.9 C) 97.8 F (36.6 C) 98.2 F (36.8 C)  TempSrc: Oral Oral Oral Oral  SpO2: 100% 100%    Weight:      Height:       General: alert, cooperative and no distress Lochia: appropriate Uterine Fundus: firm Incision: Healing well with no  significant drainage, No significant erythema, Dressing is clean, dry, and intact DVT Evaluation: No evidence of DVT seen on physical exam. Negative Homan's sign. No cords or calf tenderness. Labs: Lab Results  Component Value Date   WBC 10.7 (H) 04/20/2017   HGB 9.8 (L) 04/20/2017   HCT 30.0 (L) 04/20/2017   MCV 75.4 (L) 04/20/2017   PLT 383 04/20/2017   CMP Latest Ref Rng & Units 09/06/2016  Glucose 65 - 99 mg/dL 106(H)  BUN 6 - 20 mg/dL 5(L)  Creatinine 0.44 - 1.00 mg/dL 0.82  Sodium 135 - 145 mmol/L 138  Potassium 3.5 - 5.1 mmol/L 3.4(L)  Chloride 101 - 111 mmol/L 106  CO2 22 - 32 mmol/L 25  Calcium 8.9 - 10.3 mg/dL 8.6(L)  Total Protein 6.5 - 8.1 g/dL -  Total Bilirubin 0.3 - 1.2 mg/dL -  Alkaline Phos 38 -  126 U/L -  AST 15 - 41 U/L -  ALT 14 - 54 U/L -    Discharge instruction: per After Visit Summary and "Baby and Me Booklet".  After visit meds:  Allergies as of 04/22/2017   No Known Allergies     Medication List    STOP taking these medications   glyBURIDE 2.5 MG tablet Commonly known as:  DIABETA     TAKE these medications   docusate sodium 100 MG capsule Commonly known as:  COLACE Take 1 capsule (100 mg total) by mouth 2 (two) times daily.   ibuprofen 600 MG tablet Commonly known as:  ADVIL,MOTRIN Take 1 tablet (600 mg total) by mouth every 6 (six) hours.   oxyCODONE-acetaminophen 5-325 MG tablet Commonly known as:  ROXICET Take 1 tablet by mouth every 6 (six) hours as needed.       Diet: routine diet  Activity: Advance as tolerated. Pelvic rest for 6 weeks.   Outpatient follow up:6 weeks Follow up Appt: Future Appointments Date Time Provider [place]  05/08/2017 10:00 AM Lavonia Drafts, MD [facility] None   Follow up Visit:No Follow-up on file.  Postpartum contraception: Tubal Ligation  Newborn Data: Live born female  Birth Weight: 8 lb 1.1 oz (3660 g) APGAR: 9, 10  Baby Feeding: Bottle Disposition:home with  mother   04/22/2017 Katherine Basset, DO

## 2017-05-08 ENCOUNTER — Encounter: Payer: Self-pay | Admitting: Obstetrics & Gynecology

## 2017-05-08 ENCOUNTER — Ambulatory Visit (INDEPENDENT_AMBULATORY_CARE_PROVIDER_SITE_OTHER): Payer: Medicaid Other | Admitting: Obstetrics & Gynecology

## 2017-05-08 VITALS — BP 119/98 | HR 108 | Ht 69.0 in | Wt 298.0 lb

## 2017-05-08 DIAGNOSIS — O872 Hemorrhoids in the puerperium: Secondary | ICD-10-CM

## 2017-05-08 MED ORDER — IBUPROFEN 600 MG PO TABS: 600.0000 mg | ORAL_TABLET | Freq: Four times a day (QID) | ORAL | 3 refills | Status: DC

## 2017-05-08 NOTE — Progress Notes (Signed)
Post Partum Exam  Alicia Pollard is a 26 y.o. [phone] female who presents for a postpartum visit. She is 2 weeks postpartum following a spontaneous vaginal delivery. I have fully reviewed the prenatal and intrapartum course. The delivery was at 38 gestational weeks.  Anesthesia: epidural. Postpartum course has been complicated with pain from her hemorrhoids.  She reports that they are not painful today.   Baby's course has been unremarkable. Baby is feeding by bottle - Similac Advance. Bleeding thin lochia. Bowel function is normal. Bladder function is abnormal: abnormal, experiencing some constipation. . Patient is not sexually active. Contraception method is tubal ligation. Postpartum depression screening: score 10.   The following portions of the patient's history were reviewed and updated as appropriate: allergies, current medications, past family history, past medical history, past social history, past surgical history and problem list.  Review of Systems Pertinent items are noted in HPI.    Objective:  unknown if currently breastfeeding.  BP (!) 119/98   Pulse (!) 108   Ht 5\' 9"  (1.753 m)   Wt 298 lb (135.2 kg)   Breastfeeding? No   BMI 44.01 kg/m  Pt in NAD Abd: obese; umbilical incision healing well GU: small non thrombosed hemorrhoid.          Assessment:    2 weeks postpartum exam.  H/o GDM Sx hemorrhoids  Plan:   1. Contraception: tubal ligation 2. Needs a 2 hours GTT in 4 weeks 3. Follow up in: 4 weeks or as needed.  4. Reviewed use of Miralax and PreparationH mixed with hydrocortisone  [name] L. Harraway-Smith, M.D., [facility] CoreFACOG

## 2017-05-08 NOTE — Patient Instructions (Signed)
Miralax  Preparation H mixed with hydrocortisone  Prunes  About Constipation  Constipation Overview Constipation is the most common gastrointestinal complaint - about 4 million Americans experience constipation and make 2.5 million physician visits a year to get help for the problem.  Constipation can occur when the colon absorbs too much water, the colon's muscle contraction is slow or sluggish, and/or there is delayed transit time through the colon.  The result is stool that is hard and dry.  Indicators of constipation include straining during bowel movements greater than 25% of the time, having fewer than three bowel movements per week, and/or the feeling of incomplete evacuation.  There are established guidelines ([place] II ) for defining constipation. A person needs to have two or more of the following symptoms for at least 12 weeks (not necessarily consecutive) in the preceding 12 months: . Straining in  greater than 25% of bowel movements . Lumpy or hard stools in greater than 25% of bowel movements . Sensation of incomplete emptying in greater than 25% of bowel movements . Sensation of anorectal obstruction/blockade in greater than 25% of bowel movements . Manual maneuvers to help empty greater than 25% of bowel movements (e.g., digital evacuation, support of the pelvic floor)  . Less than  3 bowel movements/week . Loose stools are not present, and criteria for irritable bowel syndrome are insufficient  Common Causes of Constipation . Lack of fiber in your diet . Lack of physical activity . Medications, including iron and calcium supplements  . Dairy intake . Dehydration . Abuse of laxatives  Travel  Irritable Bowel Syndrome  Pregnancy  Luteal phase of menstruation (after ovulation and before menses)  Colorectal problems  Intestinal Dysfunction  Treating Constipation  There are several ways of treating constipation, including changes to diet and exercise, use of  laxatives, adjustments to the pelvic floor, and scheduled toileting.  These treatments include: . increasing fiber and fluids in the diet  . increasing physical activity . learning muscle coordination   learning proper toileting techniques and toileting modifications   designing and sticking  to a toileting schedule     2007, [employer] Doc.22

## 2017-06-06 ENCOUNTER — Ambulatory Visit: Payer: Medicaid Other | Admitting: Family Medicine

## 2017-06-09 ENCOUNTER — Encounter: Payer: Self-pay | Admitting: Family Medicine

## 2017-09-04 IMAGING — US US MFM OB FOLLOW-UP
1 series · 14 of 28 positions shown · non-contrast
Comparison: none

[Series 1: us mfm ob follow-up · 39 acquisitions, 14 frames shown]
[im 2/39]
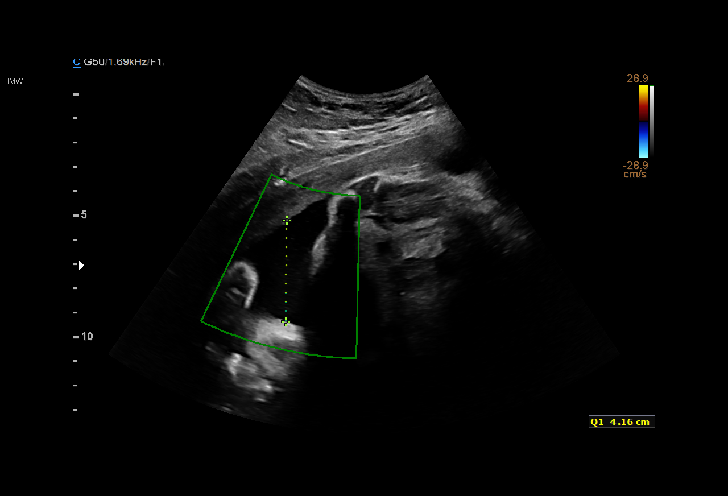
[im 5/39]
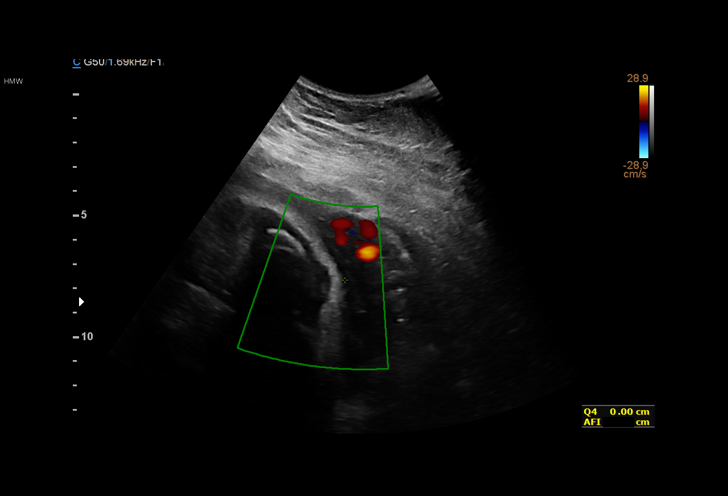
[im 8/39]
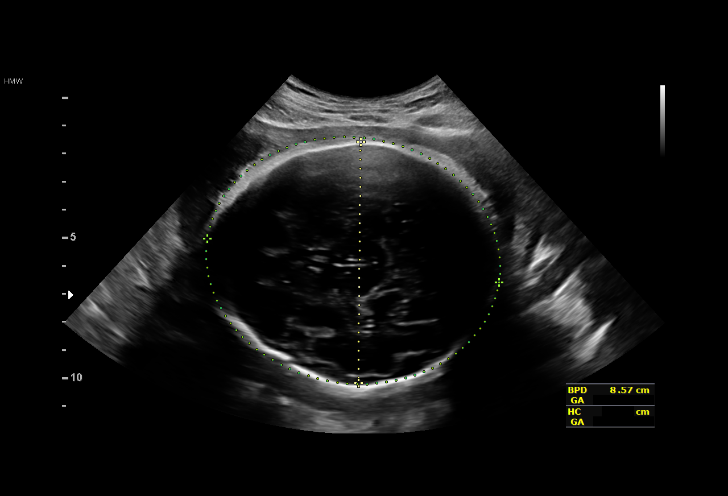
[im 10/39]
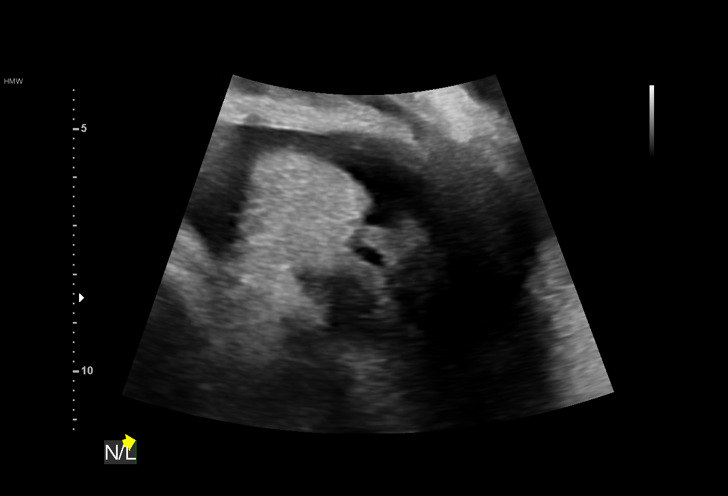
[im 13/39]
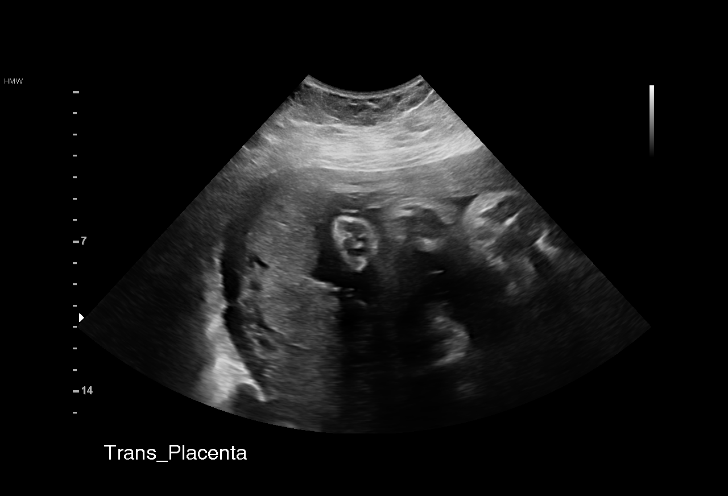
[im 16/39]
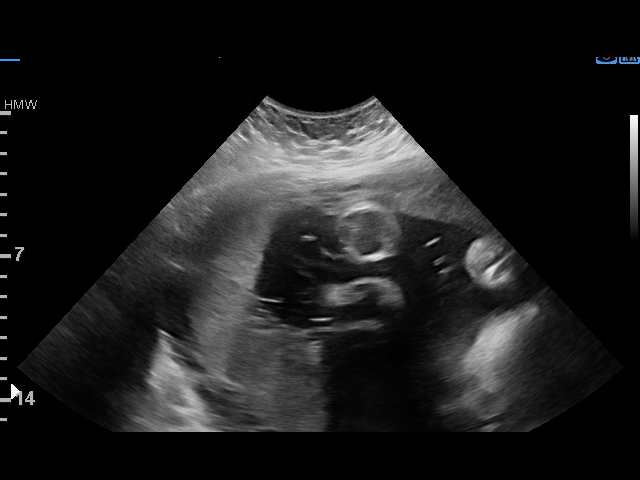
[im 19/39]
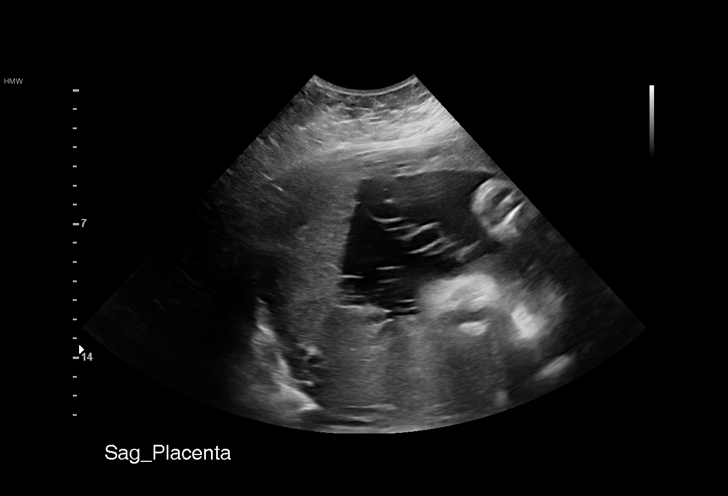
[im 22/39]
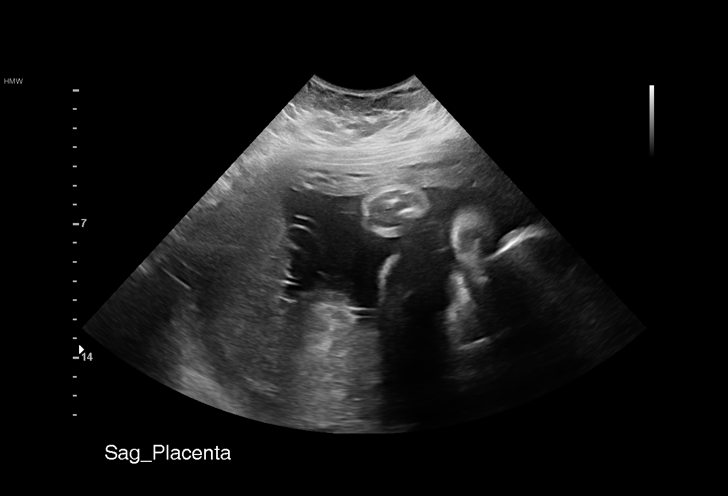
[im 24/39]
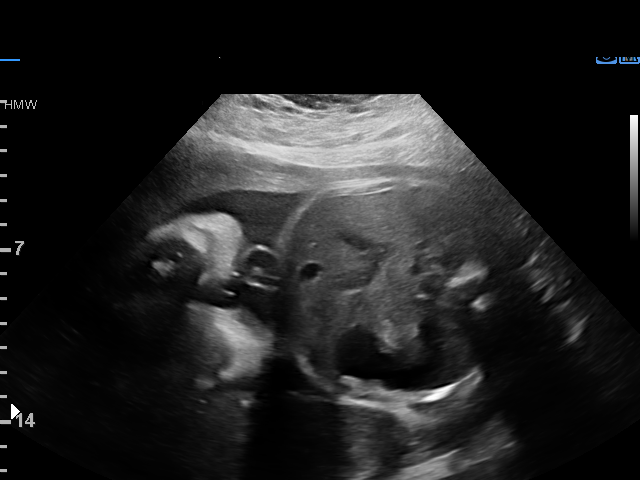
[im 27/39]
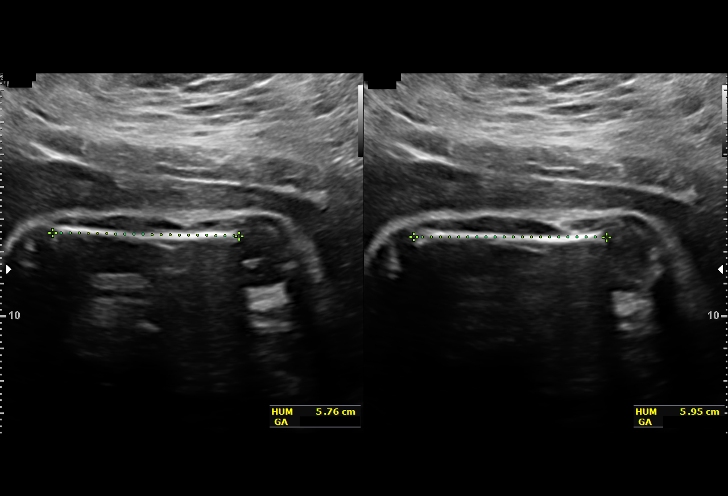
[im 30/39]
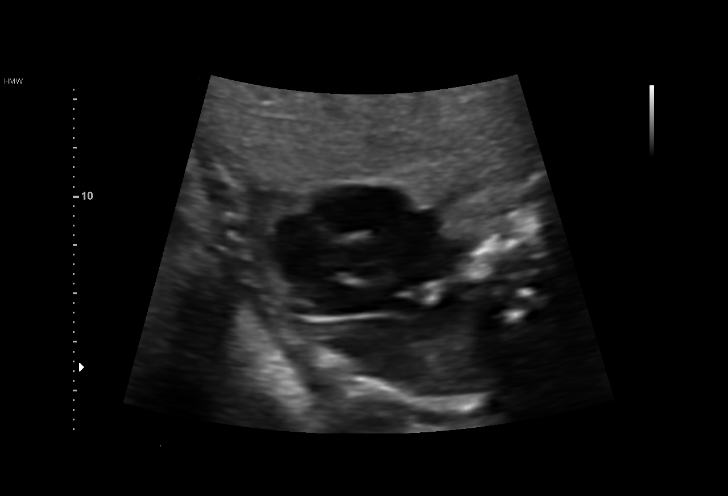
[im 33/39]
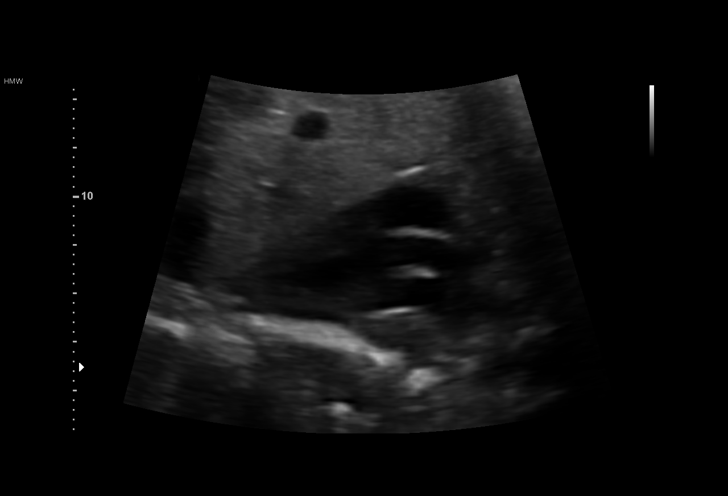
[im 36/39]
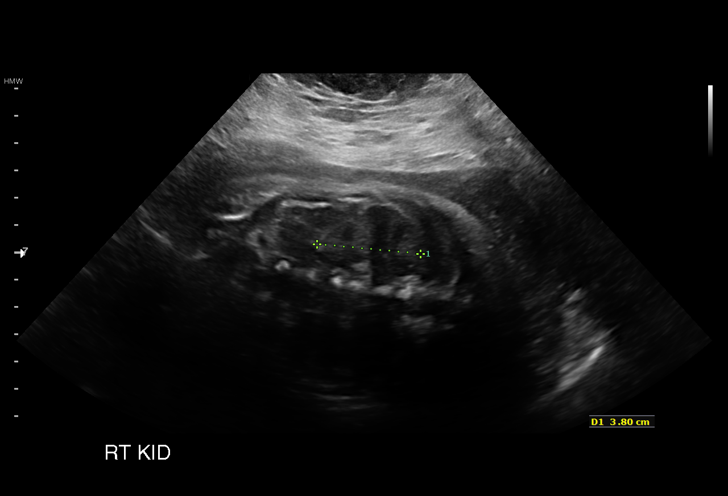
[im 39/39]
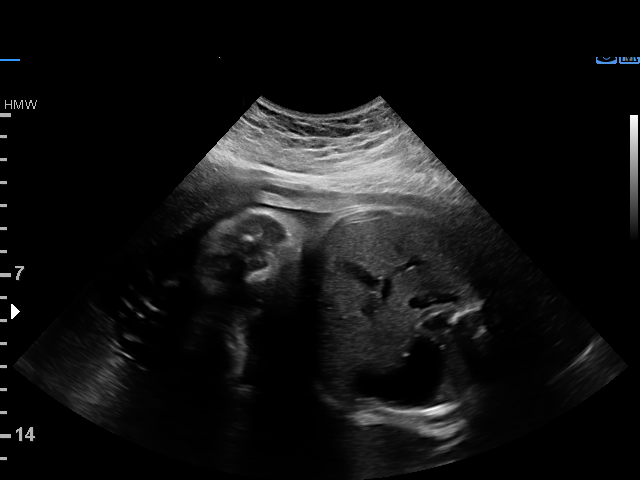

[14 of 28 positions shown; findings below may reference images not displayed]

OB/Gyn Clinic

Indications

32 weeks gestation of pregnancy
Encounter for other antenatal screening
follow-up
Maternal morbid obesity
Gestational diabetes in pregnancy, diet
controlled
OB History

Blood Type:            Height:         Weight (lb):  320      BMI:
Gravidity:    5         Term:   2        Prem:   0        SAB:   0
TOP:          2       Ectopic:  0        Living: 1
Fetal Evaluation

Num Of Fetuses:     1
Fetal Heart         150
Rate(bpm):
Cardiac Activity:   Observed
Presentation:       Cephalic
Placenta:           Posterior, above cervical os
P. Cord Insertion:  Not well visualized
Amniotic Fluid
AFI FV:      Subjectively within normal limits

AFI Sum(cm)     %Tile       Largest Pocket(cm)
10.79           23

RUQ(cm)       RLQ(cm)       LUQ(cm)        LLQ(cm)
4.16          0
Biophysical Evaluation

Amniotic F.V:   Within normal limits       F. Tone:        Observed
F. Movement:    Observed                   Score:          [DATE]
F. Breathing:   Not Observed
Biometry

BPD:      86.2  mm     G. Age:  34w 5d         93  %    CI:        79.81   %   70 - 86
FL/HC:      21.4   %   19.9 -
HC:      304.9  mm     G. Age:  33w 6d         49  %    HC/AC:      1.01       0.96 -
AC:      302.4  mm     G. Age:  34w 2d         89  %    FL/BPD:     75.9   %   71 - 87
FL:       65.4  mm     G. Age:  33w 5d         69  %    FL/AC:      21.6   %   20 - 24
HUM:      58.5  mm     G. Age:  33w 6d         83  %
Est. FW:    4852  gm      5 lb 3 oz     80  %
Gestational Age

LMP:           31w 1d       Date:   07/31/16                 EDD:   05/07/17
U/S Today:     34w 1d                                        EDD:   04/16/17
Best:          32w 4d    Det. By:   Early Ultrasound         EDD:   04/27/17
(09/06/16)
Anatomy

Cranium:               Appears normal         Aortic Arch:            Not well visualized
Cavum:                 Previously seen        Ductal Arch:            Not well visualized
Ventricles:            Appears normal         Diaphragm:              Previously seen
Choroid Plexus:        Previously seen        Stomach:                Appears normal, left
sided
Cerebellum:            Previously seen        Abdomen:                Previously seen
Posterior Fossa:       Previously seen        Abdominal Wall:         Not well visualized
Nuchal Fold:           Not applicable (>20    Cord Vessels:           Previously seen
wks GA)
Face:                  Not well visualized    Kidneys:                Appear normal
Lips:                  Previously seen        Bladder:                Appears normal
Heart:                 Not well visualized    Spine:                  Previously seen
RVOT:                  Appears normal         Upper Extremities:      Previously seen
LVOT:                  Appears normal         Lower Extremities:      Previously seen

Other:  Fetus appears to be a female. Heels previously visualized. Open
hands previously visualized. Technically difficult due to maternal
habitus and fetal position.
Cervix Uterus Adnexa

Cervix
Not visualized (advanced GA >52wks)
Uterus
No abnormality visualized.

Left Ovary
No adnexal mass visualized.

Right Ovary
No adnexal mass visualized.

Cul De Sac:   No free fluid seen.

Adnexa:       No abnormality visualized.
Impression

Single IUP at 32w 4d
Maternal obesity, gestational diabetes - diet controlled
Limited views of the fetal heart and face obtained
The remainder of the fetal anatomy appears normal
The estimated fetal weight is at the 80th %tile
Posterior placenta without previa
Normal amniotic fluid volume
Recommendations

Recommend follow-up ultrasound examination in 4 weeks for
interval growth

## 2018-06-17 IMAGING — US US OB COMP LESS 14 WK
1 series · 14 of 28 positions shown · non-contrast
Comparison: None.

CLINICAL DATA: Vomiting blood.

EXAM:
OBSTETRIC <14 WK US AND TRANSVAGINAL OB US
TECHNIQUE: Both transabdominal and transvaginal ultrasound examinations were
performed for complete evaluation of the gestation as well as the
maternal uterus, adnexal regions, and pelvic cul-de-sac.
Transvaginal technique was performed to assess early pregnancy.

[Series 1: us ob comp less 14 wk · 0.22mm/px · 14 of 94 slices shown]
[im 4/94]
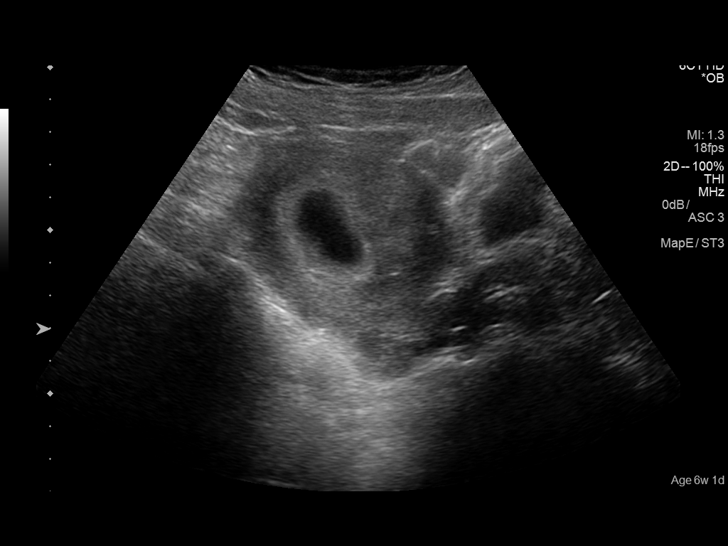
[im 11/94]
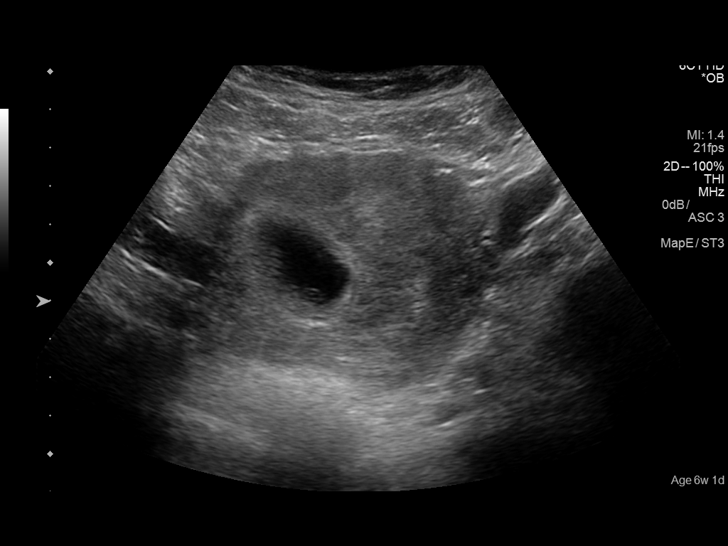
[im 18/94]
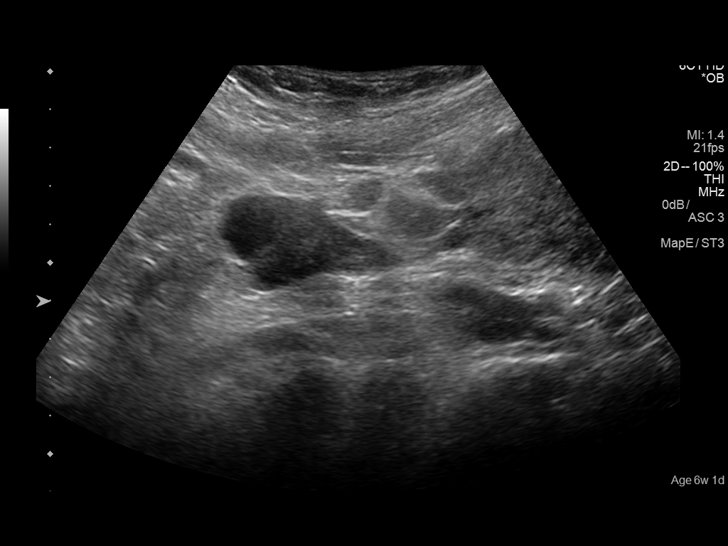
[im 25/94]
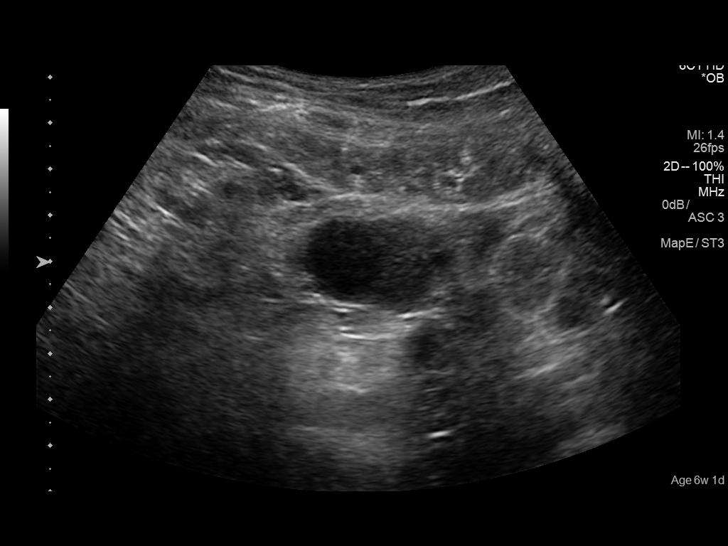
[im 32/94]
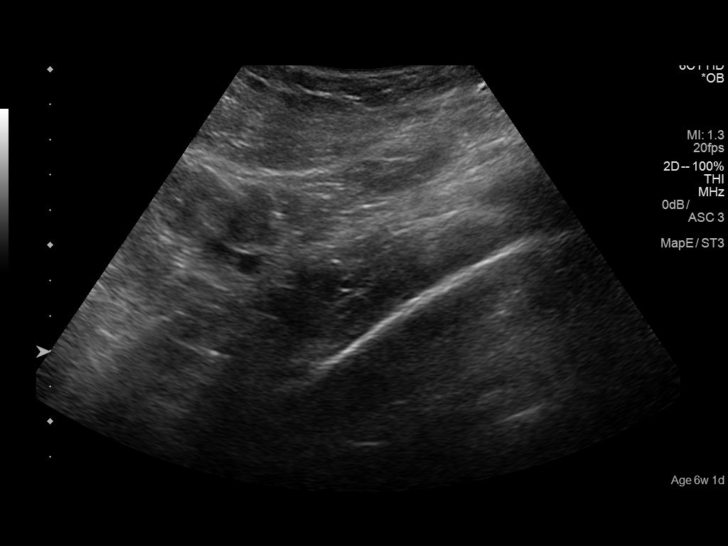
[im 38/94]
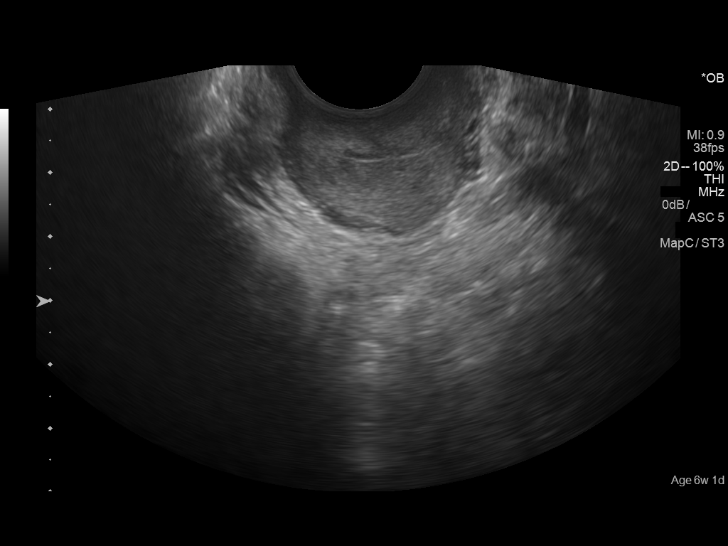
[im 45/94]
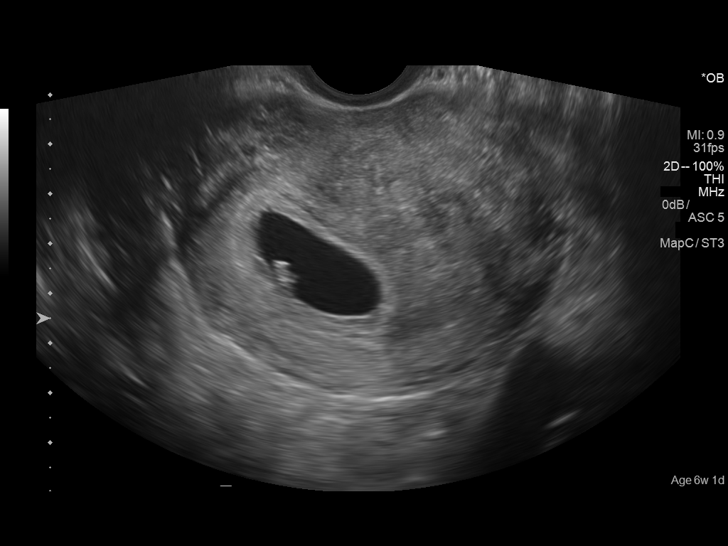
[im 52/94]
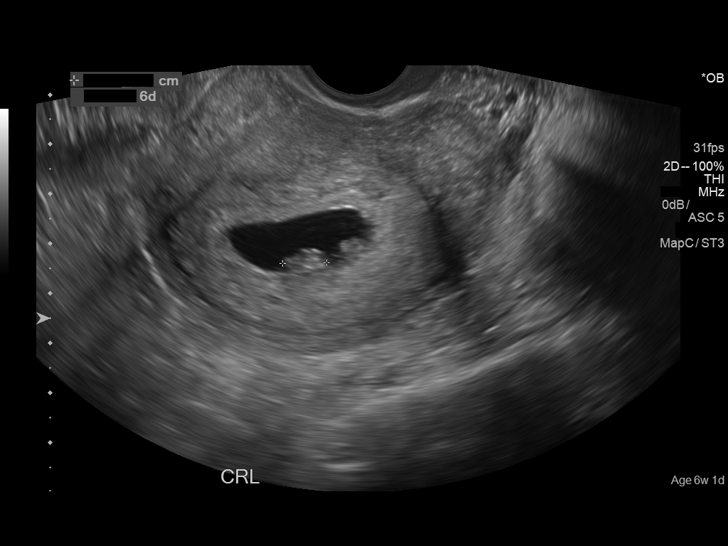
[im 59/94]
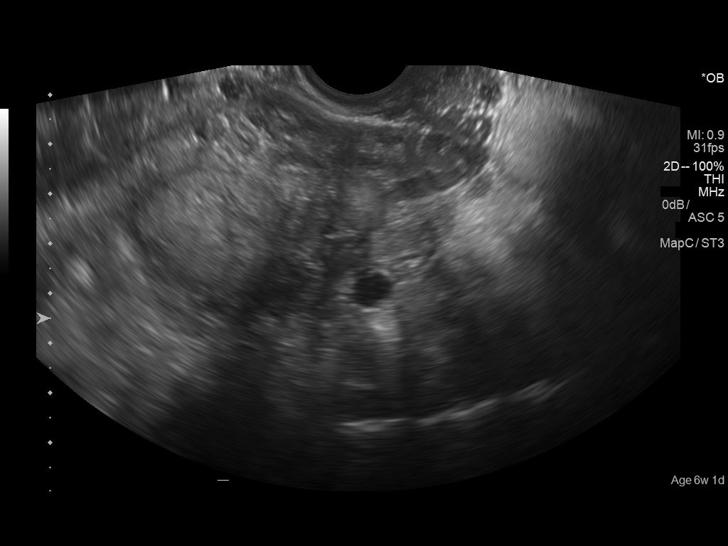
[im 66/94]
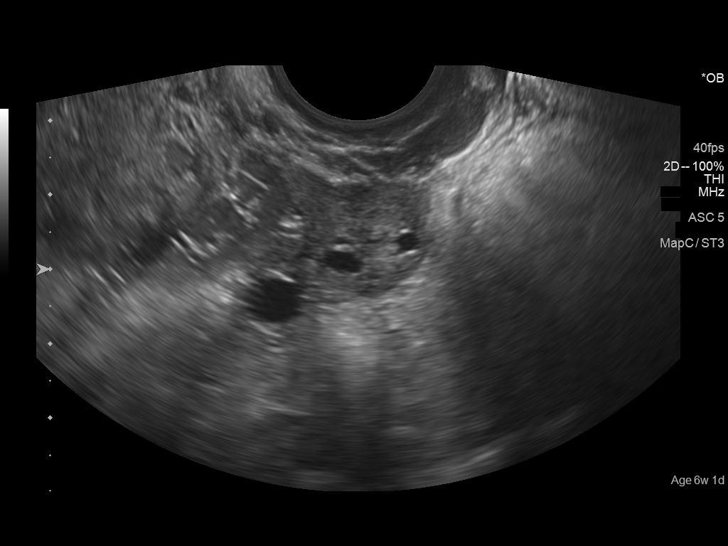
[im 73/94]
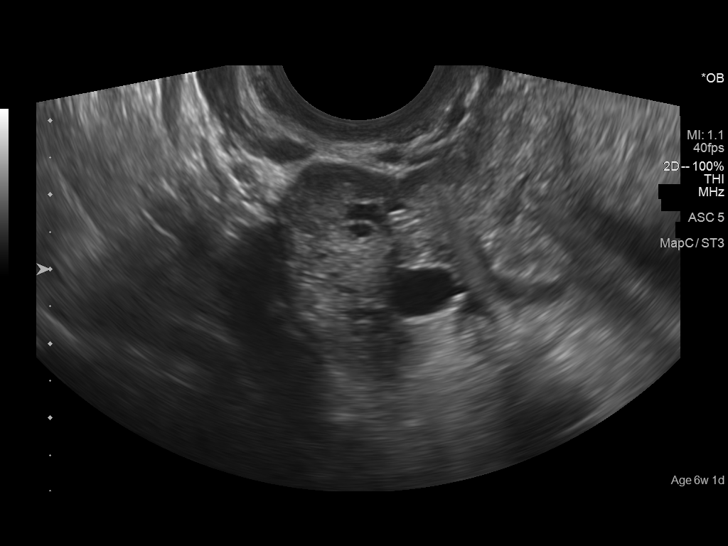
[im 80/94]
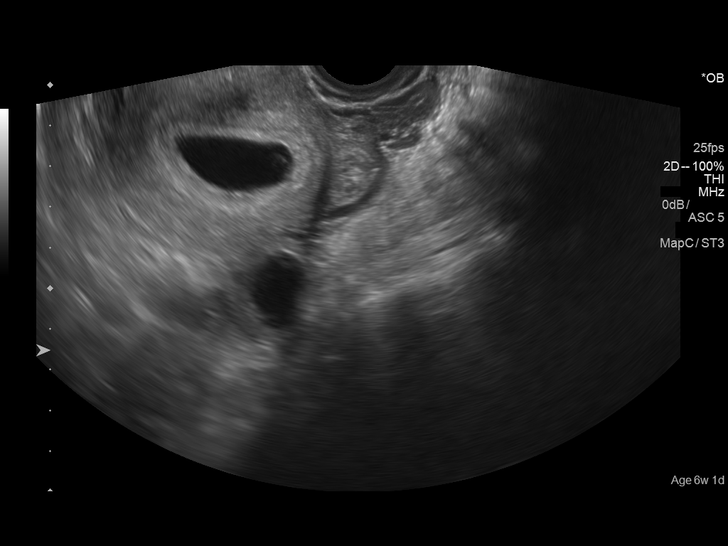
[im 87/94]
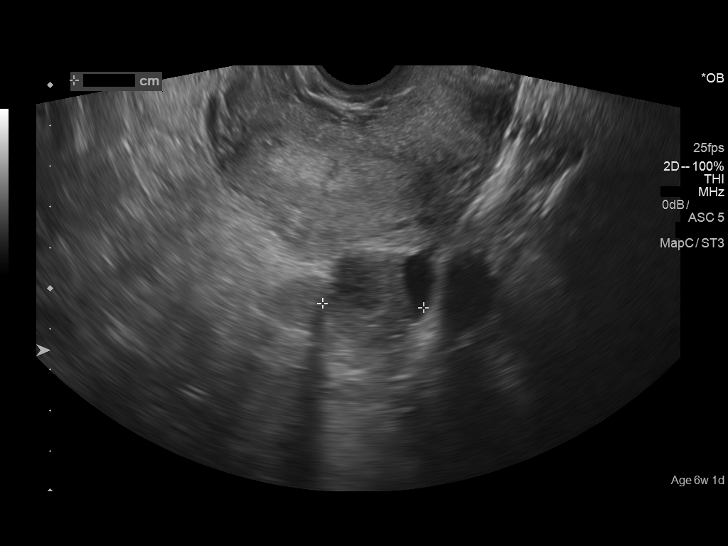
[im 94/94]
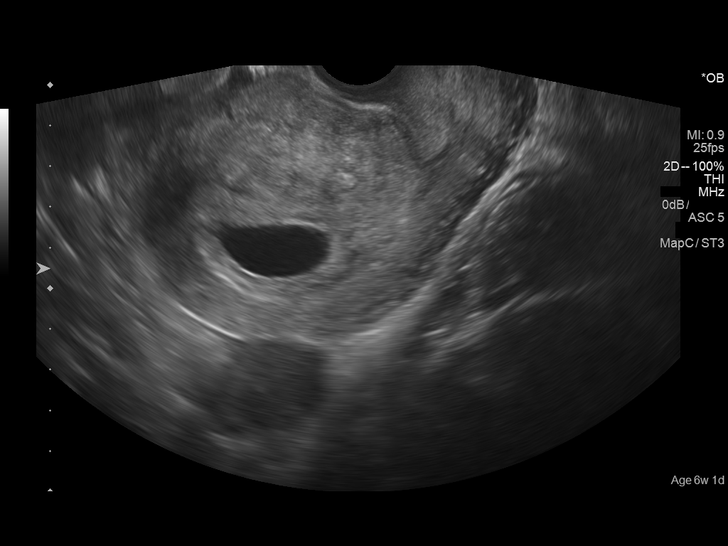

[14 of 28 positions shown; findings below may reference images not displayed]

FINDINGS: Intrauterine gestational sac: Single

Yolk sac:  Yes

Embryo:  Yes

Cardiac Activity: Yes

Heart Rate: 117  bpm

CRL:  8  mm   6 w   5 d                  US EDC: 04/27/2017

Subchorionic hemorrhage:  None visualized.

Maternal uterus/adnexae:

Right ovary: Normal

Left ovary: Normal

Other :None

Free fluid:  None
IMPRESSION: 1. Single living intrauterine gestation. The estimated gestational
age is 6 weeks and 5 days.
2. No complicating features identified.

## 2023-01-07 ENCOUNTER — Ambulatory Visit
Admission: EM | Admit: 2023-01-07 | Discharge: 2023-01-07 | Disposition: A | Discharge status: Home / Self Care | Payer: BLUE CROSS/BLUE SHIELD | Attending: Nurse Practitioner | Admitting: Nurse Practitioner

## 2023-01-07 ENCOUNTER — Ambulatory Visit (INDEPENDENT_AMBULATORY_CARE_PROVIDER_SITE_OTHER): Payer: Self-pay

## 2023-01-07 DIAGNOSIS — M545 Low back pain, unspecified: Secondary | ICD-10-CM

## 2023-01-07 DIAGNOSIS — S39012A Strain of muscle, fascia and tendon of lower back, initial encounter: Secondary | ICD-10-CM

## 2023-01-07 MED ORDER — KETOROLAC TROMETHAMINE 30 MG/ML IJ SOLN
60.0000 mg | Freq: Once | INTRAMUSCULAR | Status: AC
  Administered 2023-01-07: 60 mg via INTRAMUSCULAR

## 2023-01-07 MED ORDER — NAPROXEN 500 MG PO TABS: 500.0000 mg | ORAL_TABLET | Freq: Two times a day (BID) | ORAL | 0 refills | Status: AC

## 2023-01-07 MED ORDER — CYCLOBENZAPRINE HCL 10 MG PO TABS: 10.0000 mg | ORAL_TABLET | Freq: Two times a day (BID) | ORAL | 0 refills | Status: AC | PRN

## 2023-01-07 NOTE — ED Triage Notes (Signed)
Pt c/o of back pain while cleaning shower, heard crack while bending over. Both knees have numbness and tingling.

## 2023-01-07 NOTE — Discharge Instructions (Signed)
You were given a Toradol injection in clinic today. Do not take any over the counter NSAID's such as Advil, ibuprofen, Aleve, or naproxen for 24 hours.  You may take tylenol if needed Flexeril as needed.  Please note this medication can make you drowsy.  Do not drink alcohol or drive while on this medication Start naproxen twice daily tomorrow, 3/6.  Stay hydrated while on this medication Heat to your back as needed Rest Follow-up with your PCP if your symptoms do not improve Please go to the emergency room if you have any worsening symptoms

## 2023-01-07 NOTE — ED Provider Notes (Signed)
[facility]    CSN: [id number] Arrival date & time: 01/07/23  1750      History   Chief Complaint Chief Complaint  Patient presents with   Back Pain    HPI Alicia Pollard is a 32 y.o. female presents for evaluation of back pain.  Patient reports 3 hours prior to arrival she was leaning over cleaning a shower when she heard a crack through her lower back and had immediate onset of a midline low back pain that has been constant since that time that does not radiate.  It is an aching throbbing and stabbing pain.  Denies any numbness/tingling/weakness of her lower extremities, no bowel or bladder incontinence, no saddle paresthesia.  Denies any history of fractures or surgeries to her back.  Reports remote history of back pain but no chronic back pain.  She did call EMS when it happened but did not want to be taken to the ER due to wait times and drove herself to urgent care.  She has not taken any OTC medications.  No other concerns at this time.   Back Pain   Past Medical History:  Diagnosis Date   Diabetes mellitus without complication ([place])    Gestational diabetes    glyburide    Patient Active Problem List   Diagnosis Date Noted   NSVD (normal spontaneous vaginal delivery) 04/22/2017   Status post tubal ligation 04/22/2017   Gestational diabetes mellitus (GDM) controlled on oral hypoglycemic drug 04/20/2017   GDM (gestational diabetes mellitus) 01/28/2017   Supervision of normal pregnancy 01/24/2017   Insufficient prenatal care, second trimester 01/24/2017   Obesity (BMI 30-39.9) 01/24/2017    Past Surgical History:  Procedure Laterality Date   NO PAST SURGERIES     TUBAL LIGATION N/A 04/21/2017   Procedure: POST PARTUM TUBAL LIGATION;  Surgeon: Chancy Milroy, MD;  Location: [facility];  Service: Gynecology;  Laterality: N/A;    OB History     Gravida  5   Para  3   Term  3   Preterm      AB  2   Living  3      SAB      IAB  2    Ectopic      Multiple  0   Live Births  3            Home Medications    Prior to Admission medications   Medication Sig Start Date End Date Taking? Authorizing Provider  cyclobenzaprine (FLEXERIL) 10 MG tablet Take 1 tablet (10 mg total) by mouth 2 (two) times daily as needed for muscle spasms. 01/07/23  Yes Melynda Ripple, NP  naproxen (NAPROSYN) 500 MG tablet Take 1 tablet (500 mg total) by mouth 2 (two) times daily for 5 days. 01/08/23 01/13/23 Yes Melynda Ripple, NP  docusate sodium (COLACE) 100 MG capsule Take 1 capsule (100 mg total) by mouth 2 (two) times daily. Patient not taking: Reported on 05/08/2017 04/22/17   Mum[place], Lauralyn Primes, DO    Family History Family History  Problem Relation Age of Onset   Asthma Sister    Diabetes Maternal Aunt    Hypertension Maternal Aunt    Hypertension Maternal Grandmother    Cancer Neg Hx    Stroke Neg Hx     Social History Social History   Tobacco Use   Smoking status: Former   Smokeless tobacco: Never  Substance Use Topics   Alcohol use: No  Drug use: No     Allergies   Patient has no known allergies.   Review of Systems Review of Systems  Musculoskeletal:  Positive for back pain.     Physical Exam Triage Vital Signs ED Triage Vitals  Enc Vitals Group     BP 01/07/23 1833 116/65     Pulse Rate 01/07/23 1833 90     Resp 01/07/23 1833 20     Temp --      Temp src --      SpO2 01/07/23 1833 98 %     Weight --      Height --      Head Circumference --      Peak Flow --      Pain Score 01/07/23 1830 10     Pain Loc --      Pain Edu? --      Excl. in [place]? --    No data found.  Updated Vital Signs BP 116/65 (BP Location: Right Arm)   Pulse 90   Temp 98.7 F (37.1 C) (Oral)   Resp 20   LMP 12/21/2022 (Approximate)   SpO2 98%   Visual Acuity Right Eye Distance:   Left Eye Distance:   Bilateral Distance:    Right Eye Near:   Left Eye Near:    Bilateral Near:     Physical Exam Vitals and  nursing note reviewed.  Constitutional:      Appearance: Normal appearance.  HENT:     Head: Normocephalic and atraumatic.  Eyes:     Pupils: Pupils are equal, round, and reactive to light.  Cardiovascular:     Rate and Rhythm: Normal rate.  Pulmonary:     Effort: Pulmonary effort is normal.  Musculoskeletal:     Cervical back: Normal.     Thoracic back: Normal.     Lumbar back: Spasms, tenderness and bony tenderness present. No swelling, edema, deformity, signs of trauma or lacerations. Decreased range of motion. No scoliosis.       Back:     Comments: Strength 5/5 BLE. Pt would not lay flat for SLR due to pain  Skin:    General: Skin is warm and dry.  Neurological:     General: No focal deficit present.     Mental Status: She is alert and oriented to person, place, and time.  Psychiatric:        Mood and Affect: Mood normal.        Behavior: Behavior normal.      UC Treatments / Results  Labs (all labs ordered are listed, but only abnormal results are displayed) Labs Reviewed - No data to display  EKG   Radiology DG Lumbar Spine Complete  Result Date: 01/07/2023 CLINICAL DATA:  Back pain following bending, initial encounter EXAM: LUMBAR SPINE - COMPLETE 4+ VIEW COMPARISON:  None Available. FINDINGS: Five lumbar type vertebral bodies are well visualized. Vertebral body height is well maintained. No pars defects are noted. No anterolisthesis is seen. No soft tissue abnormality is noted. Changes of prior tubal ligation are noted. IMPRESSION: No acute abnormality noted. Electronically Signed   By: Inez Catalina M.D.   On: 01/07/2023 19:20    Procedures Procedures (including critical care time)  Medications Ordered in UC Medications  ketorolac (TORADOL) 30 MG/ML injection 60 mg (60 mg Intramuscular Given 01/07/23 1855)    Initial Impression / Assessment and Plan / UC Course  I have reviewed the triage vital signs and the nursing  notes.  Pertinent labs & imaging results  that were available during my care of the patient were reviewed by me and considered in my medical decision making (see chart for details).     Patient given Toradol injection in clinic.  She reports some improvement in pain after injection.  She was instructed no NSAIDs for 24 hours and she verbalized understanding Lumbar x-ray negative for fracture Discussed lumbar strain/musculoskeletal cause of symptoms Flexeril as needed.  Side effect profile reviewed Start naproxen twice daily for 5 days tomorrow Heat to the back as needed Rest PCP follow-up 2 to 3 days for recheck ER precautions reviewed and patient verbalized understanding Final Clinical Impressions(s) / UC Diagnoses   Final diagnoses:  Acute midline low back pain without sciatica  Strain of lumbar region, initial encounter     Discharge Instructions      You were given a Toradol injection in clinic today. Do not take any over the counter NSAID's such as Advil, ibuprofen, Aleve, or naproxen for 24 hours.  You may take tylenol if needed Flexeril as needed.  Please note this medication can make you drowsy.  Do not drink alcohol or drive while on this medication Start naproxen twice daily tomorrow, 3/6.  Stay hydrated while on this medication Heat to your back as needed Rest Follow-up with your PCP if your symptoms do not improve Please go to the emergency room if you have any worsening symptoms     ED Prescriptions     Medication Sig Dispense Auth. Provider   cyclobenzaprine (FLEXERIL) 10 MG tablet Take 1 tablet (10 mg total) by mouth 2 (two) times daily as needed for muscle spasms. 10 tablet Melynda Ripple, NP   naproxen (NAPROSYN) 500 MG tablet Take 1 tablet (500 mg total) by mouth 2 (two) times daily for 5 days. 10 tablet Melynda Ripple, NP      PDMP not reviewed this encounter.   Melynda Ripple, NP 01/07/23 [id number]

## 2024-05-07 ENCOUNTER — Emergency Department (HOSPITAL_COMMUNITY)
Admission: EM | Admit: 2024-05-07 | Discharge: 2024-05-08 | Disposition: A | Discharge status: Home / Self Care | Attending: Emergency Medicine | Admitting: Emergency Medicine

## 2024-05-07 ENCOUNTER — Other Ambulatory Visit: Payer: Self-pay

## 2024-05-07 DIAGNOSIS — N611 Abscess of the breast and nipple: Secondary | ICD-10-CM | POA: Diagnosis present

## 2024-05-07 NOTE — ED Triage Notes (Signed)
 Patient reports skin abscess at left breast with no drainage onset this week . No fever or chills.

## 2024-05-08 DIAGNOSIS — N611 Abscess of the breast and nipple: Secondary | ICD-10-CM | POA: Diagnosis not present

## 2024-05-08 MED ORDER — LIDOCAINE-EPINEPHRINE (PF) 2 %-1:200000 IJ SOLN
10.0000 mL | Freq: Once | INTRAMUSCULAR | Status: AC
  Administered 2024-05-08: 10 mL
  Filled 2024-05-08: qty 20

## 2024-05-08 MED ORDER — CEPHALEXIN 500 MG PO CAPS
500.0000 mg | ORAL_CAPSULE | Freq: Two times a day (BID) | ORAL | 0 refills | Status: AC
Start: 2024-05-08 — End: ?

## 2024-05-08 MED ORDER — CEPHALEXIN 250 MG PO CAPS
500.0000 mg | ORAL_CAPSULE | Freq: Once | ORAL | Status: AC
  Administered 2024-05-08: 500 mg via ORAL
  Filled 2024-05-08: qty 2

## 2024-05-08 NOTE — ED Provider Notes (Signed)
 [facility] Emergency Department Provider Note MRN:  [id number]  Arrival date & time: 05/08/24     Chief Complaint   Skin Abscess (Left  Breast)   History of Present Illness   Alicia Pollard is a 33 y.o. year-old female presents to the ED with chief complaint of left-sided breast abscess.  She has had developing abscess for the past week or so.  She reports that this is a recurrent issue for her.  She states that normally it is lanced or drained.  She denies fevers or chills.  Denies any treatments prior to arrival.  Denies any other associated symptoms.  History provided by patient.   Review of Systems  Pertinent positive and negative review of systems noted in HPI.    Physical Exam   Vitals:   05/07/24 2205  BP: (!) 144/74  Pulse: 77  Resp: 16  Temp: 98.8 F (37.1 C)  SpO2: 98%    CONSTITUTIONAL:  non toxic-appearing, NAD NEURO:  Alert and oriented x 3, CN 3-12 grossly intact EYES:  eyes equal and reactive ENT/NECK:  Supple, no stridor  CARDIO:  normal rate,  appears well-perfused  PULM:  No respiratory distress,  GI/GU:  non-distended,  MSK/SPINE:  No gross deformities, no edema, moves all extremities  SKIN:  no rash, atraumatic, chaperone present during breast exam, there is a 1 x 2 cm fluctuant abscess to the left medial chest wall   *Additional and/or pertinent findings included in MDM below  Diagnostic and Interventional Summary    EKG Interpretation Date/Time:    Ventricular Rate:    PR Interval:    QRS Duration:    QT Interval:    QTC Calculation:   R Axis:      Text Interpretation:         Labs Reviewed - No data to display  No orders to display    Medications  lidocaine -EPINEPHrine  (XYLOCAINE  W/EPI) 2 %-1:200000 (PF) injection 10 mL (has no administration in time range)  cephALEXin  (KEFLEX ) capsule 500 mg (has no administration in time range)     Procedures  /  Critical Care Aspiration of  blood/fluid  Date/Time: 05/08/2024 3:27 AM  Performed by: Vicky Charleston, PA-C Authorized by: Vicky Charleston, PA-C  Consent: Verbal consent obtained Risks and benefits: risks, benefits and alternatives were discussed Consent given by: patient Patient understanding: patient states understanding of the procedure being performed Patient consent: the patient's understanding of the procedure matches consent given Procedure consent: procedure consent matches procedure scheduled Relevant documents: relevant documents present and verified Test results: test results available and properly labeled Site marked: the operative site was marked Imaging studies: imaging studies available Required items: required blood products, implants, devices, and special equipment available Patient identity confirmed: verbally with patient Local anesthesia used: yes Anesthesia: local infiltration  Anesthesia: Local anesthesia used: yes Local Anesthetic: lidocaine  1% with epinephrine  Anesthetic total: 1 mL  Sedation: Patient sedated: no  Patient tolerance: patient tolerated the procedure well with no immediate complications Comments: 3 cc of purulent discharge was aspirated     ED Course and Medical Decision Making  I have reviewed the triage vital signs, the nursing notes, and pertinent available records from the EMR.  Social Determinants Affecting Complexity of Care: Patient has no clinically significant social determinants affecting this chief complaint..   ED Course:    Medical Decision Making Patient here with developing abscess to the left medial breast/chest.  This is a recurrent problem for the patient.  She  states that the symptoms began developing earlier this week.  She reports some associated tenderness.  I was able to aspirate the abscess at the bedside and pulled back 3 to 4 cc of purulent discharge.  There is no surrounding erythema.  Patient will be discharged home on Keflex  and  given instructions to follow-up at the breast center per protocol.  Risk Prescription drug management.         Consultants: No consultations were needed in caring for this patient.   Treatment and Plan: Emergency department workup does not suggest an emergent condition requiring admission or immediate intervention beyond  what has been performed at this time. The patient is safe for discharge and has  been instructed to return immediately for worsening symptoms, change in  symptoms or any other concerns    Final Clinical Impressions(s) / ED Diagnoses     ICD-10-CM   1. Breast abscess  N61.1       ED Discharge Orders          Ordered    cephALEXin  (KEFLEX ) 500 MG capsule  2 times daily        05/08/24 0330              Discharge Instructions Discussed with and Provided to Patient:     Discharge Instructions      Please follow-up with the breast center in 2 days.  Please take antibiotics as directed.  Please apply warm compresses.  Return for new or worsening symptoms including fever, worsening pain, worsening swelling.       Vicky Charleston, PA-C 05/08/24 [id number]    Carita Senior, MD 05/08/24 [phone]

## 2024-05-08 NOTE — Discharge Instructions (Addendum)
 Please follow-up with the breast center in 2 days.  Please take antibiotics as directed.  Please apply warm compresses.  Return for new or worsening symptoms including fever, worsening pain, worsening swelling.

## 2024-07-29 ENCOUNTER — Other Ambulatory Visit: Payer: Self-pay

## 2024-07-29 ENCOUNTER — Emergency Department (HOSPITAL_COMMUNITY)
Admission: EM | Admit: 2024-07-29 | Discharge: 2024-07-29 | Disposition: A | Discharge status: Home / Self Care | Attending: Emergency Medicine | Admitting: Emergency Medicine

## 2024-07-29 DIAGNOSIS — Y92410 Unspecified street and highway as the place of occurrence of the external cause: Secondary | ICD-10-CM | POA: Diagnosis not present

## 2024-07-29 DIAGNOSIS — S39012A Strain of muscle, fascia and tendon of lower back, initial encounter: Secondary | ICD-10-CM | POA: Insufficient documentation

## 2024-07-29 DIAGNOSIS — M545 Low back pain, unspecified: Secondary | ICD-10-CM | POA: Diagnosis present

## 2024-07-29 MED ORDER — IBUPROFEN 800 MG PO TABS: 800.0000 mg | ORAL_TABLET | Freq: Three times a day (TID) | ORAL | 0 refills | Status: AC | PRN

## 2024-07-29 MED ORDER — METHOCARBAMOL 500 MG PO TABS: 500.0000 mg | ORAL_TABLET | Freq: Two times a day (BID) | ORAL | 0 refills | Status: AC | PRN
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
## 2024-07-29 NOTE — Discharge Instructions (Signed)
 You could try ice or heat to the area as needed for pain.  Ibuprofen  and muscle relaxant.  Follow-up with your regular doctor.  Return to the emergency department if any worsening or concerning symptoms
